# Patient Record
Sex: Female | Born: 1970 | Race: White | Hispanic: No | Marital: Single | Smoking: Never smoker
Health system: Southern US, Community
[De-identification: ages and names within clinical notes are randomized; demographics above are authoritative.]

## PROBLEM LIST (undated history)

## (undated) DIAGNOSIS — I10 Essential (primary) hypertension: Secondary | ICD-10-CM

## (undated) DIAGNOSIS — F319 Bipolar disorder, unspecified: Secondary | ICD-10-CM

## (undated) HISTORY — DX: Bipolar disorder, unspecified: F31.9

## (undated) HISTORY — DX: Essential (primary) hypertension: I10

---

## 2015-03-04 ENCOUNTER — Encounter: Payer: Self-pay | Admitting: Emergency Medicine

## 2015-03-04 ENCOUNTER — Emergency Department
Admission: EM | Admit: 2015-03-04 | Discharge: 2015-03-04 | Disposition: A | Discharge status: Home / Self Care | Payer: Managed Care, Other (non HMO) | Source: Home / Self Care | Attending: Emergency Medicine | Admitting: Emergency Medicine

## 2015-03-04 ENCOUNTER — Encounter: Payer: Self-pay | Admitting: Obstetrics & Gynecology

## 2015-03-04 ENCOUNTER — Ambulatory Visit (INDEPENDENT_AMBULATORY_CARE_PROVIDER_SITE_OTHER): Payer: Managed Care, Other (non HMO) | Admitting: Obstetrics & Gynecology

## 2015-03-04 VITALS — Ht 61.0 in | Wt 167.0 lb

## 2015-03-04 DIAGNOSIS — I10 Essential (primary) hypertension: Secondary | ICD-10-CM

## 2015-03-04 DIAGNOSIS — Z0000 Encounter for general adult medical examination without abnormal findings: Secondary | ICD-10-CM

## 2015-03-04 DIAGNOSIS — Z124 Encounter for screening for malignant neoplasm of cervix: Secondary | ICD-10-CM

## 2015-03-04 DIAGNOSIS — Z01419 Encounter for gynecological examination (general) (routine) without abnormal findings: Secondary | ICD-10-CM

## 2015-03-04 DIAGNOSIS — Z1151 Encounter for screening for human papillomavirus (HPV): Secondary | ICD-10-CM | POA: Diagnosis not present

## 2015-03-04 MED ORDER — METOPROLOL TARTRATE 25 MG PO TABS: 100.0000 mg | ORAL_TABLET | Freq: Two times a day (BID) | ORAL | Status: DC

## 2015-03-04 NOTE — ED Notes (Signed)
Elevated BP 168/106, today, headaches off and on, asymptomatic today

## 2015-03-04 NOTE — Discharge Instructions (Signed)

## 2015-03-04 NOTE — ED Provider Notes (Signed)
CSN: [id number]     Arrival date & time 03/04/15  1419 History   First MD Initiated Contact with Patient 03/04/15 1508     Chief Complaint  Patient presents with  . Hypertension   (Consider location/radiation/quality/duration/timing/severity/associated sxs/prior Treatment) Patient is a 44 y.o. female presenting with hypertension. The history is provided by the patient. No language interpreter was used.  Hypertension This is a new problem. The current episode started more than 1 week ago. The problem occurs constantly. The problem has been gradually worsening. Nothing aggravates the symptoms. Nothing relieves the symptoms. She has tried nothing for the symptoms.  Pt was seen by Dr. Marice Potter and had elevated BP.   Pt is scheduled next week with Tandy Gaw however Dr. Marice Potter wanted her to come here to get started on a medication.   Pt has a history of bipolar dissorder.   Pt psychiatrist advised no maxide, hctz or ace inhibitors.     Past Medical History  Diagnosis Date  . Bipolar 1 disorder    History reviewed. No pertinent past surgical history. Family History  Problem Relation Age of Onset  . Diabetes Maternal Grandfather   . Diabetes Paternal Grandmother   . Cancer Father     melanoma  . Hyperlipidemia Father    History  Substance Use Topics  . Smoking status: Never Smoker   . Smokeless tobacco: Never Used  . Alcohol Use: No   OB History    Gravida Para Term Preterm AB TAB SAB Ectopic Multiple Living   Review of Systems  All other systems reviewed and are negative.   Allergies  Review of patient's allergies indicates not on file.  Home Medications   Prior to Admission medications   Medication Sig Start Date End Date Taking? Authorizing Provider  carbamazepine (CARBATROL) 200 MG 12 hr capsule Take 400 mg by mouth 2 (two) times daily. 12/24/14   Historical Provider, MD  lithium carbonate (LITHOBID) 300 MG CR tablet Take 600 mg by mouth 2 (two) times daily.  12/24/14   Historical Provider, MD   BP 168/106 mmHg  Pulse 91  Temp(Src) 98.6 F (37 C) (Oral)  Ht  (1.549 m)  Wt 167 lb (75.751 kg)  BMI 31.57 kg/m2  SpO2 98%  LMP 02/18/2015 Physical Exam  Constitutional: She is oriented to person, place, and time. She appears well-developed and well-nourished.  HENT:  Head: Normocephalic and atraumatic.  Right Ear: External ear normal.  Left Ear: External ear normal.  Nose: Nose normal.  Mouth/Throat: Oropharynx is clear and moist.  Eyes: EOM are normal. Pupils are equal, round, and reactive to light.  Neck: Normal range of motion.  Cardiovascular: Normal rate and normal heart sounds.   Pulmonary/Chest: Effort normal.  Abdominal: She exhibits no distension.  Musculoskeletal: Normal range of motion.  Neurological: She is alert and oriented to person, place, and time.  Skin: Skin is warm.  Psychiatric: She has a normal mood and affect.  Nursing note and vitals reviewed.   ED Course   Pt reports she had a normal   Procedures (including critical care time) Labs Review Labs Reviewed - No data to display  Imaging Review No results found.   MDM Bp here 168/106.   Pt had normal bun and creatine 3 months ago,    1. Essential hypertension      I discussed with Lesly Rubenstein who will see pt next week. Pt  started on metoprolol 25 bid.  Pt will see Lesly RubensteinJade last week.  Lonia SkinnerLeslie K ArcolaSofia, PA-C 03/04/15 Rickey [id number]

## 2015-03-04 NOTE — Progress Notes (Signed)
Subjective:    Ayani Conwell is a 44 y.o. DW P1 3(44 yo son) female who presents for an annual exam. The patient has no complaints today. The patient is sexually active. GYN screening history: last pap: was normal. The patient wears seatbelts: yes. The patient participates in regular exercise: yes. Has the patient ever been transfused or tattooed?: no. The patient reports that there is not domestic violence in her life.   Menstrual History: OB History    Gravida Para Term Preterm AB TAB SAB Ectopic Multiple Living   1 1 1       2       Menarche age: [age]  Patient's last menstrual period was 02/18/2015.    The following portions of the patient's history were reviewed and updated as appropriate: allergies, current medications, past family history, past medical history, past social history, past surgical history and problem list.  Review of Systems A comprehensive review of systems was negative. Monogamous for 4 years, BF had a vasectomy. Declines STI testing. 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   Objective:    Ht 5\' 1"  (1.549 m)  Wt 167 lb (75.751 kg)  BMI 31.57 kg/m2  LMP 02/18/2015  General Appearance:    Alert, cooperative, no distress, appears stated age  Head:    Normocephalic, without obvious abnormality, atraumatic  Eyes:    PERRL, conjunctiva/corneas clear, EOM's intact, fundi    benign, both eyes  Ears:    Normal TM's and external ear canals, both ears  Nose:   Nares normal, septum midline, mucosa normal, no drainage    or sinus tenderness  Throat:   Lips, mucosa, and tongue normal; teeth and gums normal  Neck:   Supple, symmetrical, trachea midline, no adenopathy;    thyroid:  no enlargement/tenderness/nodules; no carotid   bruit or JVD  Back:     Symmetric, no curvature, ROM normal, no CVA tenderness  Lungs:     Clear to auscultation bilaterally, respirations unlabored  Chest Wall:    No tenderness or deformity   Heart:    Regular rate and rhythm, S1 and S2 normal, no murmur, rub    or gallop  Breast Exam:    No tenderness, masses, or nipple abnormality  Abdomen:     Soft, non-tender, bowel sounds active all four quadrants,    no masses, no organomegaly  Genitalia:    Normal female without lesion, discharge or tenderness, NSSA, NT, mobile, no adnexal masses     Extremities:   Extremities normal, atraumatic, no cyanosis or edema  Pulses:   2+ and symmetric all extremities  Skin:   Skin color, texture, turgor normal, no rashes or lesions  Lymph nodes:   Cervical, supraclavicular, and axillary nodes normal  Neurologic:   CNII-XII intact, normal strength, sensation and reflexes    throughout  .    Assessment:    Healthy female exam.    Plan:     Breast self exam technique reviewed and patient encouraged to perform self-exam monthly. Mammogram. Thin prep Pap smear. with cotesting Fasting labs at her convenience

## 2015-03-06 LAB — CYTOLOGY - PAP

## 2015-03-11 ENCOUNTER — Encounter: Payer: Self-pay | Admitting: Physician Assistant

## 2015-03-11 ENCOUNTER — Ambulatory Visit (INDEPENDENT_AMBULATORY_CARE_PROVIDER_SITE_OTHER): Payer: Managed Care, Other (non HMO) | Admitting: Physician Assistant

## 2015-03-11 VITALS — BP 180/99 | HR 76 | Temp 98.4°F | Resp 20 | Ht 61.0 in | Wt 166.0 lb

## 2015-03-11 DIAGNOSIS — F3174 Bipolar disorder, in full remission, most recent episode manic: Secondary | ICD-10-CM

## 2015-03-11 DIAGNOSIS — Z0000 Encounter for general adult medical examination without abnormal findings: Secondary | ICD-10-CM

## 2015-03-11 DIAGNOSIS — Z1231 Encounter for screening mammogram for malignant neoplasm of breast: Secondary | ICD-10-CM

## 2015-03-11 DIAGNOSIS — Z131 Encounter for screening for diabetes mellitus: Secondary | ICD-10-CM

## 2015-03-11 DIAGNOSIS — S6991XA Unspecified injury of right wrist, hand and finger(s), initial encounter: Secondary | ICD-10-CM

## 2015-03-11 DIAGNOSIS — Z13220 Encounter for screening for lipoid disorders: Secondary | ICD-10-CM

## 2015-03-11 DIAGNOSIS — I10 Essential (primary) hypertension: Secondary | ICD-10-CM

## 2015-03-11 DIAGNOSIS — F319 Bipolar disorder, unspecified: Secondary | ICD-10-CM | POA: Insufficient documentation

## 2015-03-11 MED ORDER — AMLODIPINE BESYLATE 10 MG PO TABS: 10.0000 mg | ORAL_TABLET | Freq: Every day | ORAL | Status: DC

## 2015-03-11 MED ORDER — METOPROLOL TARTRATE 100 MG PO TABS: 100.0000 mg | ORAL_TABLET | Freq: Two times a day (BID) | ORAL | Status: DC

## 2015-03-11 NOTE — Progress Notes (Signed)
Subjective:    Patient ID: Christine Cisneros, female    DOB: 01/18/1971, 44 y.o.   MRN: [id number]  HPI Patient is a 44 year old female who presents to the clinic to establish care.  .. Active Ambulatory Problems    Diagnosis Date Noted  . Bipolar disorder 03/11/2015  . Essential hypertension, benign 03/11/2015   Resolved Ambulatory Problems    Diagnosis Date Noted  . No Resolved Ambulatory Problems   Past Medical History  Diagnosis Date  . Bipolar 1 disorder   . Hypertension    .[name]. Family History  Problem Relation Age of Onset  . Diabetes Maternal Grandfather   . Diabetes Paternal Grandmother   . Cancer Father     melanoma  . Hyperlipidemia Father    .[name]. History   Social History  . Marital Status: Single    Spouse Name: N/A  . Number of Children: N/A  . Years of Education: N/A   Occupational History  . RN    Social History Main Topics  . Smoking status: Never Smoker   . Smokeless tobacco: Never Used  . Alcohol Use: No  . Drug Use: No  . Sexual Activity:    Partners: Male   Other Topics Concern  . Not on file   Social History Narrative   Patient was referred to our office for her blood pressure issues. She was originally seen by Dr. Vania ReaMyrick Dove and blood pressure was in the 180s over 90s. She then went to urgent care and was started on metoprolol 100 mg twice a day. Headaches did resolve with metoprolol. She has no other complaints. She has no vision changes, shortness of breath, chest pains or palpitations. No prior history of hypertension.  She does also complain of some right anterior hand pain after a door hitting her anterior hand as she was trying to block it from hitting her face approximately a week ago. She has not tried anything to make better. Nothing seems to make worse. She has full function of hand. It only hurts when touched over area.  Review of Systems  All other systems reviewed and are negative.      Objective:   Physical Exam   Constitutional: She is oriented to person, place, and time. She appears well-developed and well-nourished.  HENT:  Head: Normocephalic and atraumatic.  Cardiovascular: Normal rate, regular rhythm and normal heart sounds.   Pulmonary/Chest: Effort normal and breath sounds normal.  Musculoskeletal:  Anterior right hand there is a mobile nodule that is tender to palpation.no surrounding swelling or edema. Normal range of motion of right hand. Hand grip 5 out of 5.  Neurological: She is alert and oriented to person, place, and time.  Skin: Skin is dry.  Psychiatric: She has a normal mood and affect. Her behavior is normal.          Assessment & Plan:  HTN- continue on metoprolol 100 mg twice a day. Added Norvasc 10 mg once a day. Patient does have some blood pressure medications that she has to stay away from including Maxide, HCTZ, Ace inhibitors and arms. They can increase her lithium levels. Follow-up with blood pressure recheck in 2 weeks as nurse visit. Will order a CMP, lipid, TSH, CBC.  Right hand contusion- discussed with patient seems like healing contusion. Offered xray to rule out fracture. Pt declined. Encouraged ice. Seemed to be a mobile nodule just under skin may be a ganglion cyst formation due to trauma. Follow up as needed.  Mammogram ordered today.

## 2015-03-18 ENCOUNTER — Ambulatory Visit (INDEPENDENT_AMBULATORY_CARE_PROVIDER_SITE_OTHER): Payer: Managed Care, Other (non HMO) | Admitting: Physician Assistant

## 2015-03-18 VITALS — BP 169/96 | HR 80 | Ht 61.0 in | Wt 166.0 lb

## 2015-03-18 DIAGNOSIS — I10 Essential (primary) hypertension: Secondary | ICD-10-CM

## 2015-03-18 LAB — CBC WITH DIFFERENTIAL/PLATELET
BASOS ABS: 0 10*3/uL (ref 0.0–0.1)
Basophils Relative: 0 % (ref 0–1)
EOS ABS: 0.1 10*3/uL (ref 0.0–0.7)
EOS PCT: 1 % (ref 0–5)
HEMATOCRIT: 40.9 % (ref 36.0–46.0)
Hemoglobin: 13.7 g/dL (ref 12.0–15.0)
Lymphocytes Relative: 10 % — ABNORMAL LOW (ref 12–46)
Lymphs Abs: 1.1 10*3/uL (ref 0.7–4.0)
MCH: 30.6 pg (ref 26.0–34.0)
MCHC: 33.5 g/dL (ref 30.0–36.0)
MCV: 91.5 fL (ref 78.0–100.0)
MONO ABS: 0.7 10*3/uL (ref 0.1–1.0)
MPV: 9.3 fL (ref 8.6–12.4)
Monocytes Relative: 7 % (ref 3–12)
Neutro Abs: 8.7 10*3/uL — ABNORMAL HIGH (ref 1.7–7.7)
Neutrophils Relative %: 82 % — ABNORMAL HIGH (ref 43–77)
PLATELETS: 346 10*3/uL (ref 150–400)
RBC: 4.47 MIL/uL (ref 3.87–5.11)
RDW: 12.5 % (ref 11.5–15.5)
WBC: 10.6 10*3/uL — AB (ref 4.0–10.5)

## 2015-03-18 LAB — LIPID PANEL
Cholesterol: 182 mg/dL (ref 0–200)
HDL: 60 mg/dL (ref >=46)
LDL CALC: 94 mg/dL (ref 0–99)
TRIGLYCERIDES: 140 mg/dL (ref <150)
Total CHOL/HDL Ratio: 3 Ratio
VLDL: 28 mg/dL (ref 0–40)

## 2015-03-18 LAB — COMPLETE METABOLIC PANEL WITH GFR
ALBUMIN: 4.6 g/dL (ref 3.5–5.2)
ALT: 20 U/L (ref 0–35)
AST: 15 U/L (ref 0–37)
Alkaline Phosphatase: 131 U/L — ABNORMAL HIGH (ref 39–117)
BUN: 7 mg/dL (ref 6–23)
CALCIUM: 9.3 mg/dL (ref 8.4–10.5)
CO2: 28 meq/L (ref 19–32)
CREATININE: 0.59 mg/dL (ref 0.50–1.10)
Chloride: 101 mEq/L (ref 96–112)
GFR, Est Non African American: >89 mL/min
GLUCOSE: 108 mg/dL — AB (ref 70–99)
POTASSIUM: 4 meq/L (ref 3.5–5.3)
Sodium: 137 mEq/L (ref 135–145)
Total Bilirubin: 0.4 mg/dL (ref 0.2–1.2)
Total Protein: 7.5 g/dL (ref 6.0–8.3)

## 2015-03-18 LAB — FERRITIN: Ferritin: 48 ng/mL (ref 10–291)

## 2015-03-18 LAB — TSH: TSH: 1.807 u[IU]/mL (ref 0.350–4.500)

## 2015-03-18 MED ORDER — HYDRALAZINE HCL 25 MG PO TABS: 25.0000 mg | ORAL_TABLET | Freq: Three times a day (TID) | ORAL | Status: DC

## 2015-03-18 NOTE — Progress Notes (Signed)
Spoke with Pt, advised of recommendations and new Rx. Pt was able to verbalize medication change and provided correct read back of medication directions. Set Pt up for nurse visit while on the phone.   Pt also wanted to know her lab results. provided information regarding results and recommendations for order of a1c. Advised I would contact the lab to see if they still had the blood or if Pt would need to go give a new sample. Advised I would let her know if she needed to go.  [facility] on [address] [place] [phone]([phone], F:[phone]).

## 2015-03-18 NOTE — Progress Notes (Signed)
Patient came into clinic for BP check. Pt states she was taking the Norvasc in the am but began experiencing side effects. She has been having diarrhea, joint pain, and one night she has some chest discomfort in the upper ride side. Pt states she started taking this Rx at night and the side effects began within 2 days of taking Rx and have gotten better by taking Rx at night but not gone completely. Pt states the lowest reading she has recorded since Rx change has been 135/90, which was within 30min of waking up. Please advise of any changes.   HTN- we certainly don't want to leave her on a medication that is causing side effects. Stop norvasc and see if side effects resolve. Let;s start hydralazine 25mg  TID. Recheck BP in 2 weeks nurse visit. Tandy GawJade Breeback PA-C

## 2015-04-01 ENCOUNTER — Ambulatory Visit (INDEPENDENT_AMBULATORY_CARE_PROVIDER_SITE_OTHER): Payer: Managed Care, Other (non HMO) | Admitting: Family Medicine

## 2015-04-01 VITALS — BP 145/89 | HR 74 | Wt 166.0 lb

## 2015-04-01 DIAGNOSIS — R7309 Other abnormal glucose: Secondary | ICD-10-CM | POA: Diagnosis not present

## 2015-04-01 DIAGNOSIS — I10 Essential (primary) hypertension: Secondary | ICD-10-CM | POA: Diagnosis not present

## 2015-04-01 DIAGNOSIS — R739 Hyperglycemia, unspecified: Secondary | ICD-10-CM

## 2015-04-01 MED ORDER — HYDRALAZINE HCL 50 MG PO TABS
50.0000 mg | ORAL_TABLET | Freq: Three times a day (TID) | ORAL | Status: DC
Start: 2015-04-01 — End: 2015-04-15

## 2015-04-01 NOTE — Progress Notes (Signed)
Called Pt and advised of new Rx. Went over dosage change and to continue keeping her BP log. Pt states there is an app she can download on her phone to keep these readings in and when she comes in for her next nurse visit we will be able to transfer them to her chart. Advised this was a very good plan. Set Pt up for 2 week nurse visit while on the phone. Advised to contact us with any questions/concerns. Verbalized understanding.

## 2015-04-01 NOTE — Progress Notes (Signed)
   Subjective:    Patient ID: Christine Cisneros, female    DOB: 12/16/1970, 44 y.o.   MRN: [id number]  HPI Patient came into office today for nurse visit BP check.   Review of Systems     Objective:   Physical Exam        Assessment & Plan:  Patient reports she takes her BP at home and her values range from 160/90's before Rx's and 145/80's after Rx's. Patient states she has no side effects from the new Rx (Hydralazine) but would like to know if there was an option that was BID rather than TID. She sometimes forgets and takes the lunchtime dose later than she should due to work. She voices knowledge of having to be selective on her BP meds due to her Lithium Rx. Patient was also supposed to get an A1c added onto her blood work from last visit, inquired about results. Upon review, there were no results in her chart. Called [facility], there was documentation from where the lab was to be added but never completed. Apologized for their error. [facility] is [facility] blood work from another provider tomorrow, printed an order for A1c to take with her for completion. There were no further questions, advised I would contact her with any changes.

## 2015-04-01 NOTE — Progress Notes (Signed)
Christine Cisneros,  Will you please let patient know that her BP appears to be improving but is not at goal yet therefore I'd recommend taking a slightly higher dose of hydralazine.  Specifically 50mg  TID that I sent to [facility], unfortunately there is no BID dosing regimen and a lot of the other BP med options will mess with her Lithium levels.  Continue taking metoprolol and return for a nurse visit BP check two weeks after the new dose of hydralazine.

## 2015-04-10 ENCOUNTER — Ambulatory Visit: Payer: Managed Care, Other (non HMO)

## 2015-04-15 ENCOUNTER — Ambulatory Visit (HOSPITAL_BASED_OUTPATIENT_CLINIC_OR_DEPARTMENT_OTHER)
Admission: RE | Admit: 2015-04-15 | Discharge: 2015-04-15 | Disposition: A | Discharge status: Home / Self Care | Payer: Managed Care, Other (non HMO) | Source: Ambulatory Visit | Attending: Obstetrics & Gynecology | Admitting: Obstetrics & Gynecology

## 2015-04-15 ENCOUNTER — Ambulatory Visit (INDEPENDENT_AMBULATORY_CARE_PROVIDER_SITE_OTHER): Payer: Managed Care, Other (non HMO) | Admitting: Family Medicine

## 2015-04-15 VITALS — BP 151/81 | HR 62

## 2015-04-15 DIAGNOSIS — Z1231 Encounter for screening mammogram for malignant neoplasm of breast: Secondary | ICD-10-CM | POA: Diagnosis not present

## 2015-04-15 DIAGNOSIS — I10 Essential (primary) hypertension: Secondary | ICD-10-CM | POA: Diagnosis not present

## 2015-04-15 MED ORDER — METOPROLOL TARTRATE 100 MG PO TABS: 100.0000 mg | ORAL_TABLET | Freq: Two times a day (BID) | ORAL | Status: DC

## 2015-04-15 MED ORDER — HYDRALAZINE HCL 100 MG PO TABS: 100.0000 mg | ORAL_TABLET | Freq: Two times a day (BID) | ORAL | Status: DC

## 2015-04-15 NOTE — Addendum Note (Signed)
Addended by: Collie SiadICHARDSON, Tamirah George M on: 04/15/2015 04:46 PM   Modules accepted: Orders

## 2015-04-15 NOTE — Progress Notes (Signed)
Just to clarify, you would like the Pt to take Hydralazine 100mg  TID. The new Rx was written with the directions of BID and quantity of TID.

## 2015-04-15 NOTE — Progress Notes (Signed)
Patient also requested during nurse visit she needs a refill on her Metoprolol Rx, sent over 90 day supply.

## 2015-04-15 NOTE — Progress Notes (Signed)
   Subjective:    Patient ID: Christine Cisneros, female    DOB: 03/21/1971, 44 y.o.   MRN: [id number]  HPI Patient is here for blood pressure. States she has been taking her medications at the new dose, and has done much better with remembering her lunchtime does. She has an alarm on her phone that goes off at noon to remind her to take the Rx. Patient states she keeps a log at home of her readings but forgot to bring them. Her usual is around 140/80 but has been up to 150/84. Her BP is typically higher in the evening.    Review of Systems     Objective:   Physical Exam        Assessment & Plan:  Took Patient's BP and advised I will route to Provider for review. Pt requested 90 supply on her Rx's so she will have a cheaper co-pay. Advised if we keep the Rx's how they are currently I will send over a 90 day supply, if the Rx's are to change we will keep at 30 day supply until correct regime is determined. Verbalized understanding.

## 2015-04-15 NOTE — Progress Notes (Signed)
BP numbers are improving but not at goal, I'd recommend increasing the dose of hydralazine to 100mg  TID. If this brings her numbers down to below 140/90 a 90 supply can be provided.  One more nurse visit for BP check in one week.

## 2015-04-15 NOTE — Progress Notes (Signed)
Clarified verbally with Dr. Ivan AnchorsHommel on directions. Patient to take Rx TID. Attempted to contact Pt, no answer. Left information on voicemail and advised of one week check. Callback information provided for any Rx questions and to schedule f/u appt.

## 2015-04-30 ENCOUNTER — Other Ambulatory Visit: Payer: Self-pay | Admitting: Physician Assistant

## 2015-04-30 LAB — HEMOGLOBIN A1C
Hgb A1c MFr Bld: 5.5 % (ref <5.7)
Mean Plasma Glucose: 111 mg/dL (ref <117)

## 2015-04-30 MED ORDER — HYDRALAZINE HCL 100 MG PO TABS: 100.0000 mg | ORAL_TABLET | Freq: Two times a day (BID) | ORAL | Status: DC

## 2015-07-07 ENCOUNTER — Encounter: Payer: Self-pay | Admitting: Physician Assistant

## 2015-07-07 ENCOUNTER — Ambulatory Visit (INDEPENDENT_AMBULATORY_CARE_PROVIDER_SITE_OTHER): Payer: Managed Care, Other (non HMO) | Admitting: Physician Assistant

## 2015-07-07 VITALS — BP 131/76 | HR 60 | Ht 61.0 in | Wt 166.0 lb

## 2015-07-07 DIAGNOSIS — I10 Essential (primary) hypertension: Secondary | ICD-10-CM

## 2015-07-07 MED ORDER — METOPROLOL TARTRATE 100 MG PO TABS: 100.0000 mg | ORAL_TABLET | Freq: Two times a day (BID) | ORAL | Status: DC

## 2015-07-07 MED ORDER — HYDRALAZINE HCL 100 MG PO TABS: 100.0000 mg | ORAL_TABLET | Freq: Three times a day (TID) | ORAL | Status: DC

## 2015-07-07 NOTE — Progress Notes (Signed)
   Subjective:    Patient ID: Christine Cisneros, female    DOB: 11-Dec-1970, 44 y.o.   MRN: [id number]  HPI  Pt presents to the clinic for HTN follow up. She is currently taking metoprolol  Bid  and hydralazine tid. She is doing great with no concerns. She denies any side effects. No CP/palpitations/SOB/dizziness. She takes her BP at home and ranging from 110-130/75-85.   Review of Systems  All other systems reviewed and are negative.      Objective:   Physical Exam  Constitutional: She is oriented to person, place, and time. She appears well-developed and well-nourished.  HENT:  Head: Normocephalic and atraumatic.  Cardiovascular: Normal rate, regular rhythm and normal heart sounds.   Pulmonary/Chest: Effort normal and breath sounds normal.  Neurological: She is alert and oriented to person, place, and time.  Psychiatric: She has a normal mood and affect. Her behavior is normal.          Assessment & Plan:  HTN- controlled today. Encouraged to keep checking BP and to keep under 140/90. Will check cmp at next visit in 6 months. Refilled metoprolol and hydralazine at same dose.   Bipolar- managed by psych doing well and controlled.

## 2015-12-03 ENCOUNTER — Other Ambulatory Visit: Payer: Self-pay | Admitting: *Deleted

## 2015-12-03 MED ORDER — METOPROLOL TARTRATE 100 MG PO TABS: 100.0000 mg | ORAL_TABLET | Freq: Two times a day (BID) | ORAL | Status: DC

## 2015-12-03 MED ORDER — HYDRALAZINE HCL 100 MG PO TABS: 100.0000 mg | ORAL_TABLET | Freq: Three times a day (TID) | ORAL | Status: DC

## 2016-01-05 ENCOUNTER — Encounter: Payer: Self-pay | Admitting: Physician Assistant

## 2016-01-05 ENCOUNTER — Ambulatory Visit (INDEPENDENT_AMBULATORY_CARE_PROVIDER_SITE_OTHER): Payer: PRIVATE HEALTH INSURANCE | Admitting: Physician Assistant

## 2016-01-05 VITALS — BP 130/82 | HR 63 | Ht 61.0 in | Wt 169.0 lb

## 2016-01-05 DIAGNOSIS — Z79899 Other long term (current) drug therapy: Secondary | ICD-10-CM | POA: Diagnosis not present

## 2016-01-05 DIAGNOSIS — I10 Essential (primary) hypertension: Secondary | ICD-10-CM

## 2016-01-05 MED ORDER — HYDRALAZINE HCL 100 MG PO TABS: 100.0000 mg | ORAL_TABLET | Freq: Three times a day (TID) | ORAL | Status: DC

## 2016-01-05 MED ORDER — METOPROLOL TARTRATE 100 MG PO TABS: 100.0000 mg | ORAL_TABLET | Freq: Two times a day (BID) | ORAL | Status: DC

## 2016-01-05 NOTE — Progress Notes (Signed)
   Subjective:    Patient ID: Christine Cisneros, female    DOB: 24-Aug-1971, 45 y.o.   MRN: [id number]  HPI Pt presents to the clinic for 6 month follow up on HTN. No CP, palpitations, headaches, vision changes. Checks at home and always below 140/90. Taking metoprolol and hydralazine.   She request me to order labs for behavioral health because of cost.    Review of Systems  All other systems reviewed and are negative.      Objective:   Physical Exam  Constitutional: She is oriented to person, place, and time. She appears well-developed and well-nourished.  HENT:  Head: Normocephalic and atraumatic.  Cardiovascular: Normal rate, regular rhythm and normal heart sounds.   Pulmonary/Chest: Effort normal and breath sounds normal. She has no wheezes.  Neurological: She is alert and oriented to person, place, and time.  Psychiatric: She has a normal mood and affect. Her behavior is normal.          Assessment & Plan:  HTN- rechecked BP and looks great. Hydralazine and metoprolol refilled for 6 months. cmp ordered.   Medication management- ordered litium for psych meds because if I order free for patient. Will fax to behavioral health.   Need CPE in 6 months.

## 2016-01-20 LAB — LITHIUM LEVEL: LITHIUM LVL: 0.8

## 2016-01-21 ENCOUNTER — Other Ambulatory Visit: Payer: Self-pay | Admitting: Physician Assistant

## 2016-01-23 ENCOUNTER — Encounter: Payer: Self-pay | Admitting: Physician Assistant

## 2016-01-24 ENCOUNTER — Other Ambulatory Visit: Payer: Self-pay | Admitting: Physician Assistant

## 2016-01-29 ENCOUNTER — Other Ambulatory Visit: Payer: Self-pay | Admitting: *Deleted

## 2016-01-29 MED ORDER — HYDRALAZINE HCL 100 MG PO TABS: 100.0000 mg | ORAL_TABLET | Freq: Three times a day (TID) | ORAL | Status: DC

## 2016-02-08 ENCOUNTER — Other Ambulatory Visit: Payer: Self-pay | Admitting: Physician Assistant

## 2016-04-19 ENCOUNTER — Telehealth: Payer: Self-pay | Admitting: *Deleted

## 2016-04-19 DIAGNOSIS — Z1231 Encounter for screening mammogram for malignant neoplasm of breast: Secondary | ICD-10-CM

## 2016-04-19 NOTE — Telephone Encounter (Signed)
Mammo ordered.

## 2016-04-26 ENCOUNTER — Ambulatory Visit (HOSPITAL_BASED_OUTPATIENT_CLINIC_OR_DEPARTMENT_OTHER): Payer: PRIVATE HEALTH INSURANCE

## 2016-05-03 ENCOUNTER — Ambulatory Visit (HOSPITAL_BASED_OUTPATIENT_CLINIC_OR_DEPARTMENT_OTHER)
Admission: RE | Admit: 2016-05-03 | Discharge: 2016-05-03 | Disposition: A | Discharge status: Home / Self Care | Payer: No Typology Code available for payment source | Source: Ambulatory Visit | Attending: Physician Assistant | Admitting: Physician Assistant

## 2016-05-03 DIAGNOSIS — Z1231 Encounter for screening mammogram for malignant neoplasm of breast: Secondary | ICD-10-CM | POA: Insufficient documentation

## 2016-06-21 ENCOUNTER — Encounter: Payer: PRIVATE HEALTH INSURANCE | Admitting: Physician Assistant

## 2016-07-05 ENCOUNTER — Ambulatory Visit (INDEPENDENT_AMBULATORY_CARE_PROVIDER_SITE_OTHER): Payer: PRIVATE HEALTH INSURANCE | Admitting: Physician Assistant

## 2016-07-05 ENCOUNTER — Encounter: Payer: Self-pay | Admitting: Physician Assistant

## 2016-07-05 VITALS — BP 159/74 | HR 85 | Ht 61.0 in | Wt 174.0 lb

## 2016-07-05 DIAGNOSIS — Z13220 Encounter for screening for lipoid disorders: Secondary | ICD-10-CM

## 2016-07-05 DIAGNOSIS — F3170 Bipolar disorder, currently in remission, most recent episode unspecified: Secondary | ICD-10-CM

## 2016-07-05 DIAGNOSIS — Z131 Encounter for screening for diabetes mellitus: Secondary | ICD-10-CM

## 2016-07-05 DIAGNOSIS — R609 Edema, unspecified: Secondary | ICD-10-CM

## 2016-07-05 DIAGNOSIS — I10 Essential (primary) hypertension: Secondary | ICD-10-CM

## 2016-07-05 DIAGNOSIS — Z23 Encounter for immunization: Secondary | ICD-10-CM | POA: Diagnosis not present

## 2016-07-05 DIAGNOSIS — Z0000 Encounter for general adult medical examination without abnormal findings: Secondary | ICD-10-CM | POA: Diagnosis not present

## 2016-07-05 DIAGNOSIS — Z79899 Other long term (current) drug therapy: Secondary | ICD-10-CM

## 2016-07-05 MED ORDER — FUROSEMIDE 20 MG PO TABS: 20.0000 mg | ORAL_TABLET | Freq: Every day | ORAL | 1 refills | Status: DC

## 2016-07-05 MED ORDER — METOPROLOL TARTRATE 100 MG PO TABS: 100.0000 mg | ORAL_TABLET | Freq: Two times a day (BID) | ORAL | 1 refills | Status: DC

## 2016-07-05 MED ORDER — HYDRALAZINE HCL 100 MG PO TABS: 100.0000 mg | ORAL_TABLET | Freq: Three times a day (TID) | ORAL | 1 refills | Status: DC

## 2016-07-05 NOTE — Patient Instructions (Signed)

## 2016-07-06 ENCOUNTER — Encounter: Payer: Self-pay | Admitting: Physician Assistant

## 2016-07-06 NOTE — Progress Notes (Signed)
Subjective:     Christine Cisneros is a 45 y.o. female and is here for a comprehensive physical exam. The patient reports problems - pt is concerned with non-pitting edema of bilateral feet and ankles. she has notice this increasing over summer. it is worse when she sits all day or stands all day. she recently bought some compression stockings but she has not tried yet. .  Social History   Social History  . Marital status: Single    Spouse name: N/A  . Number of children: N/A  . Years of education: N/A   Occupational History  . RN    Social History Main Topics  . Smoking status: Never Smoker  . Smokeless tobacco: Never Used  . Alcohol use No  . Drug use: No  . Sexual activity: Yes    Partners: Male   Other Topics Concern  . Not on file   Social History Narrative  . No narrative on file   Health Maintenance  Topic Date Due  . HIV Screening  07/05/2017 (Originally 11/17/1985)  . MAMMOGRAM  05/03/2017  . PAP SMEAR  03/03/2018  . TETANUS/TDAP  10/23/2018  . INFLUENZA VACCINE  Completed    The following portions of the patient's history were reviewed and updated as appropriate: allergies, current medications, past family history, past medical history, past social history, past surgical history and problem list.  Review of Systems Pertinent items noted in HPI and remainder of comprehensive ROS otherwise negative.   Objective:    BP (!) 159/74   Pulse 85   Ht 5\' 1"  (1.549 m)   Wt 174 lb (78.9 kg)   BMI 32.88 kg/m  General appearance: alert, cooperative and appears stated age Head: Normocephalic, without obvious abnormality, atraumatic Eyes: conjunctivae/corneas clear. PERRL, EOM's intact. Fundi benign. Ears: normal TM's and external ear canals both ears Nose: Nares normal. Septum midline. Mucosa normal. No drainage or sinus tenderness. Throat: lips, mucosa, and tongue normal; teeth and gums normal Neck: no adenopathy, no carotid bruit, no JVD, supple, symmetrical, trachea  midline and thyroid not enlarged, symmetric, no tenderness/mass/nodules Back: symmetric, no curvature. ROM normal. No CVA tenderness. Lungs: clear to auscultation bilaterally Heart: regular rate and rhythm, S1, S2 normal, no murmur, click, rub or gallop Abdomen: soft, non-tender; bowel sounds normal; no masses,  no organomegaly Extremities: edema non pitting over bilateral feet, mild.  Pulses: 2+ and symmetric Skin: Skin color, texture, turgor normal. No rashes or lesions Lymph nodes: Cervical, supraclavicular, and axillary nodes normal. Neurologic: Alert and oriented X 3, normal strength and tone. Normal symmetric reflexes. Normal coordination and gait    Assessment:    Healthy female exam.      Plan:    CPE- flu shot given. Pap up to date sees Dr. Penne LashLeggett next door. Cbc, lipid, cmp ordered. Discussed weight loss and exercise 150minutes a week. Encouraged vitamin D 800 units and calcium 1500mg  daily. Mammogram up to date.   Edema- will add lasix 20mg  daily. HCTZ is contraindicated with lithium. This could also help with BP elevation. Discussed causes of edema. Discussed conservative intervention with compression stockings, elevation, low salt diet.  Follow up in 2 months.   HTN- elevated today. Added lasix. Recheck in 1-2 months.   Bipolar- meds managed by pysch. Will check lithum level and send to pysch.  See After Visit Summary for Counseling Recommendations

## 2016-08-02 LAB — CBC AND DIFFERENTIAL
HCT: 40 % (ref 36–46)
Hemoglobin: 13.5 g/dL (ref 12.0–16.0)
Platelets: 321 K/µL (ref 150–399)
WBC: 8.5 10*3/mL

## 2016-08-02 LAB — LIPID PANEL
Cholesterol: 190 mg/dL (ref 0–200)
HDL: 72 mg/dL — AB (ref 35–70)
LDL Cholesterol: 87 mg/dL
Triglycerides: 156 mg/dL (ref 40–160)

## 2016-08-02 LAB — LITHIUM LEVEL: Lithium: 0.7

## 2016-08-02 LAB — TSH: TSH: 3.23 u[IU]/mL (ref 0.41–5.90)

## 2016-08-03 ENCOUNTER — Encounter: Payer: Self-pay | Admitting: Physician Assistant

## 2016-08-03 DIAGNOSIS — E781 Pure hyperglyceridemia: Secondary | ICD-10-CM | POA: Insufficient documentation

## 2016-08-16 ENCOUNTER — Encounter: Payer: Self-pay | Admitting: Physician Assistant

## 2016-11-09 ENCOUNTER — Other Ambulatory Visit: Payer: Self-pay | Admitting: Physician Assistant

## 2016-11-26 ENCOUNTER — Telehealth: Payer: Self-pay | Admitting: Physician Assistant

## 2016-11-26 NOTE — Telephone Encounter (Signed)
From what I can see I coded under routine physical and screening labs. Can we call [facility] and give new codes.codes are in the chart.  As well as confirm that we have done so with patient.

## 2016-11-26 NOTE — Telephone Encounter (Signed)
Christine Cisneros is being billed for blood work or lab order that was completed on  08/02/16 @ [facility]. [facility] and her insurance have stated that the coding for the procedure is in need of change b/c she is being charged a substantial amount. Her insurance has provided that she needs the code changed to a wellness code. **[facility] needs to be contacted with the new code** please give a call to [name] with any questions or concerns and feel notify her when this is completed. [name] was so nice and cooperating with this procedure.

## 2017-01-03 ENCOUNTER — Telehealth: Payer: Self-pay | Admitting: *Deleted

## 2017-01-03 NOTE — Telephone Encounter (Signed)
She needs [facility] called to change the coding for the 9/17 visit b/c she's been working on this since December last year.

## 2017-01-05 NOTE — Telephone Encounter (Signed)
I called and spoke with Lowella BandyNikki at [employer] [phone]) and reviewed all the diagnosis codes that were used.  The diagnosis codes are all appropriate therefore I could not add any new ones to refile the claim.  The denial is something [employer] is not familiar with.   I don't think this is a diagnosis issue, I think it is an Paediatric nursensurance issue.  Her plan is an Affordable Care Act Plan and is very specific for labs.  I called and had to leave a message for Petronella.  I told her to call her Insurance plan and ask why they are not covering the labs.  I told her that if she needed my help, to call me directly.

## 2017-01-10 ENCOUNTER — Ambulatory Visit: Payer: PRIVATE HEALTH INSURANCE | Admitting: Physician Assistant

## 2017-01-17 ENCOUNTER — Ambulatory Visit: Payer: PRIVATE HEALTH INSURANCE | Admitting: Physician Assistant

## 2017-01-31 ENCOUNTER — Encounter: Payer: Self-pay | Admitting: Physician Assistant

## 2017-01-31 ENCOUNTER — Ambulatory Visit (INDEPENDENT_AMBULATORY_CARE_PROVIDER_SITE_OTHER): Payer: PRIVATE HEALTH INSURANCE | Admitting: Physician Assistant

## 2017-01-31 VITALS — BP 135/82 | HR 60 | Ht 61.0 in | Wt 170.0 lb

## 2017-01-31 DIAGNOSIS — R600 Localized edema: Secondary | ICD-10-CM | POA: Diagnosis not present

## 2017-01-31 DIAGNOSIS — I10 Essential (primary) hypertension: Secondary | ICD-10-CM | POA: Diagnosis not present

## 2017-01-31 MED ORDER — HYDRALAZINE HCL 100 MG PO TABS: 100.0000 mg | ORAL_TABLET | Freq: Three times a day (TID) | ORAL | 3 refills | Status: DC

## 2017-01-31 MED ORDER — METOPROLOL TARTRATE 100 MG PO TABS: 100.0000 mg | ORAL_TABLET | Freq: Two times a day (BID) | ORAL | 3 refills | Status: DC

## 2017-01-31 MED ORDER — FUROSEMIDE 20 MG PO TABS: 20.0000 mg | ORAL_TABLET | Freq: Every day | ORAL | 3 refills | Status: DC

## 2017-01-31 NOTE — Progress Notes (Signed)
   Subjective:    Patient ID: Christine Cisneros, female    DOB: 10/27/1971, 46 y.o.   MRN: [id number]  HPI Pt is a 46 yo female who presents to the clinic to follow up on bilateral lower extremity edema and HTN. She was supposed to follow up in 2 months but has not. Denies any CP, palpitations, headaches, dizziness. Her edema is much better and only flares on long car rides or standing on her feet for many hours. She does wear low grade compression stockings which help and takes lasix 20mg  daily. For BP she is on metoprolol and hydralazine.    Review of Systems  All other systems reviewed and are negative.      Objective:   Physical Exam  Constitutional: She is oriented to person, place, and time. She appears well-developed and well-nourished.  HENT:  Head: Normocephalic and atraumatic.  Cardiovascular: Normal rate, regular rhythm and normal heart sounds.   Pulmonary/Chest: Effort normal and breath sounds normal.  Musculoskeletal:  No edema noted today.   Neurological: She is alert and oriented to person, place, and time.  Psychiatric: She has a normal mood and affect. Her behavior is normal.          Assessment & Plan:  [name].[name].Abuk was seen today for hypertension.  Diagnoses and all orders for this visit:  Essential hypertension, benign -     furosemide (LASIX) 20 MG tablet; Take 1 tablet (20 mg total) by mouth daily. -     hydrALAZINE (APRESOLINE) 100 MG tablet; Take 1 tablet (100 mg total) by mouth 3 (three) times daily. -     metoprolol (LOPRESSOR) 100 MG tablet; Take 1 tablet (100 mg total) by mouth 2 (two) times daily.  Bilateral lower extremity edema -     furosemide (LASIX) 20 MG tablet; Take 1 tablet (20 mg total) by mouth daily.   BP looks great. Refill until CPE in September. Will get labs then.

## 2017-07-04 ENCOUNTER — Other Ambulatory Visit: Payer: Self-pay | Admitting: Physician Assistant

## 2017-07-04 DIAGNOSIS — Z1231 Encounter for screening mammogram for malignant neoplasm of breast: Secondary | ICD-10-CM

## 2017-08-01 ENCOUNTER — Encounter: Payer: PRIVATE HEALTH INSURANCE | Admitting: Physician Assistant

## 2017-08-01 ENCOUNTER — Ambulatory Visit (HOSPITAL_BASED_OUTPATIENT_CLINIC_OR_DEPARTMENT_OTHER): Payer: PRIVATE HEALTH INSURANCE

## 2017-08-08 ENCOUNTER — Encounter: Payer: Self-pay | Admitting: Physician Assistant

## 2017-08-08 ENCOUNTER — Ambulatory Visit (INDEPENDENT_AMBULATORY_CARE_PROVIDER_SITE_OTHER): Payer: PRIVATE HEALTH INSURANCE | Admitting: Physician Assistant

## 2017-08-08 ENCOUNTER — Ambulatory Visit (HOSPITAL_BASED_OUTPATIENT_CLINIC_OR_DEPARTMENT_OTHER)
Admission: RE | Admit: 2017-08-08 | Discharge: 2017-08-08 | Disposition: A | Discharge status: Home / Self Care | Payer: PRIVATE HEALTH INSURANCE | Source: Ambulatory Visit | Attending: Physician Assistant | Admitting: Physician Assistant

## 2017-08-08 VITALS — BP 159/76 | HR 56 | Ht 61.0 in | Wt 167.0 lb

## 2017-08-08 DIAGNOSIS — Z6830 Body mass index (BMI) 30.0-30.9, adult: Secondary | ICD-10-CM | POA: Diagnosis not present

## 2017-08-08 DIAGNOSIS — Z1231 Encounter for screening mammogram for malignant neoplasm of breast: Secondary | ICD-10-CM | POA: Diagnosis not present

## 2017-08-08 DIAGNOSIS — Z13220 Encounter for screening for lipoid disorders: Secondary | ICD-10-CM | POA: Diagnosis not present

## 2017-08-08 DIAGNOSIS — Z0000 Encounter for general adult medical examination without abnormal findings: Secondary | ICD-10-CM

## 2017-08-08 DIAGNOSIS — I10 Essential (primary) hypertension: Secondary | ICD-10-CM

## 2017-08-08 DIAGNOSIS — Z23 Encounter for immunization: Secondary | ICD-10-CM | POA: Diagnosis not present

## 2017-08-08 DIAGNOSIS — Z131 Encounter for screening for diabetes mellitus: Secondary | ICD-10-CM | POA: Diagnosis not present

## 2017-08-08 NOTE — Patient Instructions (Signed)

## 2017-08-10 ENCOUNTER — Encounter: Payer: Self-pay | Admitting: Physician Assistant

## 2017-08-10 DIAGNOSIS — Z6830 Body mass index (BMI) 30.0-30.9, adult: Secondary | ICD-10-CM | POA: Insufficient documentation

## 2017-08-10 NOTE — Progress Notes (Signed)
Subjective:     Florella Wilkinson is a 46 y.o. female and is here for a comprehensive physical exam. The patient reports no problems.  Social History   Social History  . Marital status: Single    Spouse name: N/A  . Number of children: N/A  . Years of education: N/A   Occupational History  . RN    Social History Main Topics  . Smoking status: Never Smoker  . Smokeless tobacco: Never Used  . Alcohol use No  . Drug use: No  . Sexual activity: Yes    Partners: Male   Other Topics Concern  . Not on file   Social History Narrative  . No narrative on file   Health Maintenance  Topic Date Due  . HIV Screening  08/08/2018 (Originally 11/17/1985)  . MAMMOGRAM  08/08/2018  . TETANUS/TDAP  10/23/2018  . PAP SMEAR  03/03/2020  . INFLUENZA VACCINE  Completed    The following portions of the patient's history were reviewed and updated as appropriate: allergies, current medications, past family history, past medical history, past social history, past surgical history and problem list.  Review of Systems A comprehensive review of systems was negative.   Objective:    BP (!) 159/76   Pulse (!) 56   Ht  (1.549 m)   Wt 167 lb (75.8 kg)   BMI 31.55 kg/m  General appearance: alert, cooperative, mildly obese and moderately obese Head: Normocephalic, without obvious abnormality, atraumatic Eyes: conjunctivae/corneas clear. PERRL, EOM's intact. Fundi benign. Ears: normal TM's and external ear canals both ears Nose: Nares normal. Septum midline. Mucosa normal. No drainage or sinus tenderness. Throat: lips, mucosa, and tongue normal; teeth and gums normal Neck: no adenopathy, no carotid bruit, no JVD, supple, symmetrical, trachea midline and thyroid not enlarged, symmetric, no tenderness/mass/nodules Back: symmetric, no curvature. ROM normal. No CVA tenderness. Lungs: clear to auscultation bilaterally Heart: regular rate and rhythm, S1, S2 normal, no murmur, click, rub or  gallop Abdomen: soft, non-tender; bowel sounds normal; no masses,  no organomegaly Extremities: extremities normal, atraumatic, no cyanosis or edema Pulses: 2+ and symmetric Skin: Skin color, texture, turgor normal. No rashes or lesions Lymph nodes: Cervical, supraclavicular, and axillary nodes normal. Neurologic: Alert and oriented X 3, normal strength and tone. Normal symmetric reflexes. Normal coordination and gait    Assessment:    Healthy female exam.      Plan:    Marland KitchenMarland KitchenCamie was seen today for annual exam.  Diagnoses and all orders for this visit:  Routine physical examination -     TSH -     CBC -     VITAMIN D 25 Hydroxy (Vit-D Deficiency, Fractures) -     Vitamin B12 -     Lipid panel -     Comprehensive metabolic panel  Influenza vaccine needed -     Flu Vaccine QUAD 6+ mos PF IM (Fluarix Quad PF)  Screening for diabetes mellitus -     Comprehensive metabolic panel  Screening for lipoid disorders -     Lipid panel  Essential hypertension, benign  BMI 30.0-30.9,adult   .[name] Depression screen [facility] 2/9 08/08/2017 01/31/2017  Decreased Interest 0 0  Down, Depressed, Hopeless 0 0  PHQ - 2 Score 0 0    Encouraged vitamin D 1000 units and Calcium  or 4 servings of dairy a day.  [name].Discussed low carb diet with 1500 calories and 80g of protein.  Exercising at least 150 minutes a week.  [employer]itness Pal could be a Chief Technology Officer.  Follow up to discuss medication options.   Pt was out of BP medication. Pt will start back taking it and recheck in 2 weeks with nurse visit.   Mammogram and pap up to date.  Vaccines up to date.   See After Visit Summary for Counseling Recommendations

## 2018-02-06 ENCOUNTER — Ambulatory Visit (INDEPENDENT_AMBULATORY_CARE_PROVIDER_SITE_OTHER): Payer: Self-pay | Admitting: Physician Assistant

## 2018-02-06 ENCOUNTER — Encounter: Payer: Self-pay | Admitting: Physician Assistant

## 2018-02-06 VITALS — BP 138/84 | HR 59 | Ht 61.0 in | Wt 174.0 lb

## 2018-02-06 DIAGNOSIS — Z79899 Other long term (current) drug therapy: Secondary | ICD-10-CM

## 2018-02-06 DIAGNOSIS — R600 Localized edema: Secondary | ICD-10-CM

## 2018-02-06 DIAGNOSIS — I10 Essential (primary) hypertension: Secondary | ICD-10-CM

## 2018-02-06 MED ORDER — HYDRALAZINE HCL 100 MG PO TABS: 100.0000 mg | ORAL_TABLET | Freq: Three times a day (TID) | ORAL | 3 refills | Status: DC

## 2018-02-06 MED ORDER — FUROSEMIDE 20 MG PO TABS: 20.0000 mg | ORAL_TABLET | Freq: Every day | ORAL | 3 refills | Status: DC

## 2018-02-06 MED ORDER — METOPROLOL TARTRATE 100 MG PO TABS: 100.0000 mg | ORAL_TABLET | Freq: Two times a day (BID) | ORAL | 3 refills | Status: DC

## 2018-02-06 NOTE — Progress Notes (Signed)
   Subjective:    Patient ID: Christine Cisneros, female    DOB: 09/18/1971, 47 y.o.   MRN: [id number]  HPI Patient is a 47 year old female with a history of Bipolar and HTN presents today for medication check up. She Has been taking her medications as prescribed. She only has a question about her dosing and that she is going 8-9 hours after her lunch time dose. She says that if she goes that long without her  Medication she will sometimes feel her heart beating a little faster than normal but denies any chest pain, shortness of breath, headache, visual changes. She denies any leg swelling and does wear compression socks daily.   Past Medical History:  Diagnosis Date  . Bipolar 1 disorder (HCC)   . Hypertension       Review of Systems  Constitutional: Negative for activity change, appetite change and fatigue.  Eyes: Negative for redness and visual disturbance.  Respiratory: Negative for chest tightness and shortness of breath.   Cardiovascular: Positive for palpitations ( only when extended periods between meds). Negative for chest pain and leg swelling.  Gastrointestinal: Negative for abdominal pain, nausea and vomiting.  Neurological: Negative for dizziness, weakness, light-headedness and headaches.  Psychiatric/Behavioral: Negative for agitation and behavioral problems. The patient is not nervous/anxious and is not hyperactive.        Objective:   Physical Exam  Constitutional: She is oriented to person, place, and time. She appears well-developed and well-nourished.  HENT:  Head: Normocephalic and atraumatic.  Eyes: Conjunctivae are normal.  Cardiovascular: Normal rate, regular rhythm and normal heart sounds.  Pulmonary/Chest: Effort normal and breath sounds normal.  Neurological: She is alert and oriented to person, place, and time.  Skin: Skin is warm and dry.  Psychiatric: She has a normal mood and affect. Her behavior is normal.          Assessment & Plan:  [name].[name].Kanisha was seen  today for hypertension.  Diagnoses and all orders for this visit:  Essential hypertension, benign -     hydrALAZINE (APRESOLINE) 100 MG tablet; Take 1 tablet (100 mg total) by mouth 3 (three) times daily. -     metoprolol tartrate (LOPRESSOR) 100 MG tablet; Take 1 tablet (100 mg total) by mouth 2 (two) times daily. -     furosemide (LASIX) 20 MG tablet; Take 1 tablet (20 mg total) by mouth daily.  Medication management -     COMPLETE METABOLIC PANEL WITH GFR -     Lithium level  Bilateral lower extremity edema -     furosemide (LASIX) 20 MG tablet; Take 1 tablet (20 mg total) by mouth daily.   .. Depression screen [facility] 2/9 02/06/2018 08/08/2017 01/31/2017  Decreased Interest 0 0 0  Down, Depressed, Hopeless 0 0 0  PHQ - 2 Score 0 0 0   BP on 2nd recheck is good. Keep on same medication. Consider taking last dose of hydralazine and metoprolol before dinner.   Labs ordered for medication management.   Follow up in 6 months.

## 2018-07-25 LAB — LITHIUM LEVEL: Lithium Lvl: 0.8 mmol/L (ref 0.6–1.2)

## 2018-07-25 LAB — COMPLETE METABOLIC PANEL WITH GFR
AG Ratio: 1.4 (calc) (ref 1.0–2.5)
ALBUMIN MSPROF: 4.3 g/dL (ref 3.6–5.1)
ALKALINE PHOSPHATASE (APISO): 130 U/L — AB (ref 33–115)
ALT: 23 U/L (ref 6–29)
AST: 18 U/L (ref 10–35)
BILIRUBIN TOTAL: 0.3 mg/dL (ref 0.2–1.2)
BUN: 12 mg/dL (ref 7–25)
CO2: 27 mmol/L (ref 20–32)
Calcium: 10.2 mg/dL (ref 8.6–10.2)
Chloride: 104 mmol/L (ref 98–110)
Creat: 0.87 mg/dL (ref 0.50–1.10)
GFR, Est African American: 92 mL/min/{1.73_m2} (ref >=60)
GFR, Est Non African American: 79 mL/min/{1.73_m2} (ref >=60)
GLOBULIN: 3 g/dL (ref 1.9–3.7)
GLUCOSE: 106 mg/dL (ref 65–139)
Potassium: 4.2 mmol/L (ref 3.5–5.3)
SODIUM: 143 mmol/L (ref 135–146)
Total Protein: 7.3 g/dL (ref 6.1–8.1)

## 2018-07-25 NOTE — Progress Notes (Signed)
Call pt: lithium level stable. One liver enzyme a little elevated not concerning at this time. Recheck in 3 months.

## 2018-07-29 ENCOUNTER — Encounter: Payer: Self-pay | Admitting: *Deleted

## 2018-08-07 ENCOUNTER — Ambulatory Visit (INDEPENDENT_AMBULATORY_CARE_PROVIDER_SITE_OTHER): Payer: Self-pay | Admitting: Physician Assistant

## 2018-08-07 ENCOUNTER — Encounter: Payer: Self-pay | Admitting: Physician Assistant

## 2018-08-07 VITALS — BP 132/80 | HR 54 | Ht 61.0 in | Wt 174.0 lb

## 2018-08-07 DIAGNOSIS — I10 Essential (primary) hypertension: Secondary | ICD-10-CM

## 2018-08-07 DIAGNOSIS — Z23 Encounter for immunization: Secondary | ICD-10-CM

## 2018-08-07 DIAGNOSIS — R748 Abnormal levels of other serum enzymes: Secondary | ICD-10-CM

## 2018-08-07 DIAGNOSIS — F3170 Bipolar disorder, currently in remission, most recent episode unspecified: Secondary | ICD-10-CM

## 2018-08-07 MED ORDER — METOPROLOL TARTRATE 100 MG PO TABS: 100.0000 mg | ORAL_TABLET | Freq: Two times a day (BID) | ORAL | 1 refills | Status: DC

## 2018-08-07 MED ORDER — HYDRALAZINE HCL 100 MG PO TABS: 100.0000 mg | ORAL_TABLET | Freq: Three times a day (TID) | ORAL | 1 refills | Status: DC

## 2018-08-07 NOTE — Progress Notes (Signed)
   Subjective:    Patient ID: Christine Cisneros, female    DOB: [birth year], 47 y.o.   MRN: [id number]  HPI  Pt is a 47 yo female with HTN, Bipolar, hypertriglyceridemia who presents to the clinic for 6 month follow up.   Over all she is doing well. She is checking her BP at home and staying under 130's over 80's. No CP, palpitations, headaches or vision changes. Pt takes hydralazine and metoprolol daily.   Pt is managed by [facility] for bipolar but we check lithium level.   Last labs her liver enzyme was elevated. She needs it rechecked.   .. Active Ambulatory Problems    Diagnosis Date Noted  . Bipolar disorder ([place]) 03/11/2015  . Essential hypertension, benign 03/11/2015  . Hypertriglyceridemia 08/03/2016  . BMI 30.0-30.9,adult 08/10/2017   Resolved Ambulatory Problems    Diagnosis Date Noted  . No Resolved Ambulatory Problems   Past Medical History:  Diagnosis Date  . Bipolar 1 disorder ([place])   . Hypertension     Review of Systems See HPI.     Objective:   Physical Exam  Constitutional: She is oriented to person, place, and time. She appears well-developed and well-nourished.  HENT:  Head: Normocephalic and atraumatic.  Cardiovascular: Normal rate and regular rhythm.  Pulmonary/Chest: Effort normal and breath sounds normal.  Neurological: She is alert and oriented to person, place, and time.  Psychiatric: She has a normal mood and affect. Her behavior is normal.          Assessment & Plan:  Marland KitchenMarland KitchenDiagnoses and all orders for this visit:  Essential hypertension, benign -     hydrALAZINE (APRESOLINE) 100 MG tablet; Take 1 tablet (100 mg total) by mouth 3 (three) times daily. -     metoprolol tartrate (LOPRESSOR) 100 MG tablet; Take 1 tablet (100 mg total) by mouth 2 (two) times daily.  Bipolar affective disorder in remission (HCC)  Elevated alkaline phosphatase level -     Hepatic function panel  Need for Tdap vaccination -     Tdap vaccine greater than or equal to 7yo  IM  Need for immunization against influenza -     Flu Vaccine QUAD 36+ mos IM   BP looks good on 2nd recheck. Refilled medications.   Alk phos is just slightly elevated. Hold any tylenol, alcohol and have rechecked.   Bipolar controlled.

## 2018-08-10 ENCOUNTER — Other Ambulatory Visit: Payer: Self-pay

## 2018-08-10 DIAGNOSIS — R600 Localized edema: Secondary | ICD-10-CM

## 2018-08-10 DIAGNOSIS — I10 Essential (primary) hypertension: Secondary | ICD-10-CM

## 2018-08-10 MED ORDER — FUROSEMIDE 20 MG PO TABS: 20.0000 mg | ORAL_TABLET | Freq: Every day | ORAL | 3 refills | Status: DC

## 2018-08-21 ENCOUNTER — Ambulatory Visit (INDEPENDENT_AMBULATORY_CARE_PROVIDER_SITE_OTHER): Payer: Self-pay | Admitting: Psychiatry

## 2018-08-21 DIAGNOSIS — F319 Bipolar disorder, unspecified: Secondary | ICD-10-CM

## 2018-08-21 NOTE — Progress Notes (Signed)
[facility]  Patient ID: Christine Cisneros,  MRN: [id number]  PCP: Jomarie Longs, PA-C  Date of Evaluation: 08/21/2018 Time spent:25 minutes   HISTORY/CURRENT STATUS: HPI  Mood stable.  Sleep stable.  Good response to meds.  Tolerating.  Good job function back at her old job.  Individual Medical History/ Review of Systems: Changes? :Yes PE per pcp.  Allergies: Norvasc [amlodipine besylate]  Current Medications:  Current Outpatient Medications:  .  carbamazepine (TEGRETOL) 200 MG tablet, Take 200 mg by mouth 4 (four) times daily., Disp: , Rfl:  .  cholecalciferol (VITAMIN D) 1000 UNITS tablet, Take 2,000 Units by mouth daily. , Disp: , Rfl:  .  hydrALAZINE (APRESOLINE) 100 MG tablet, Take 1 tablet (100 mg total) by mouth 3 (three) times daily., Disp: 270 tablet, Rfl: 1 .  lithium carbonate (LITHOBID) 300 MG CR tablet, Take 600 mg by mouth 2 (two) times daily., Disp: , Rfl: 0 .  metoprolol tartrate (LOPRESSOR) 100 MG tablet, Take 1 tablet (100 mg total) by mouth 2 (two) times daily., Disp: 180 tablet, Rfl: 1 .  Multiple Vitamin (MULTIVITAMIN) tablet, Take 1 tablet by mouth daily., Disp: , Rfl:  .  calcium carbonate 200 MG capsule, Take 600 mg by mouth daily. , Disp: , Rfl:  .  furosemide (LASIX) 20 MG tablet, Take 1 tablet (20 mg total) by mouth daily. (Patient not taking: Reported on 08/21/2018), Disp: 90 tablet, Rfl: 3 Medication Side Effects: None  Family Medical/ Social History: Changes? 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 Htn.  MENTAL HEALTH EXAM:  There were no vitals taken for this visit.There is no height or weight on file to calculate BMI.  General Appearance: Casual  Eye Contact:  Good  Speech:  Normal Rate  Volume:  Normal  Mood:  Euthymic  Affect:  Full Range  Thought Process:  Coherent, Goal Directed and Linear  Orientation:  Full (Time, Place, and Person)  Thought Content: WDL   Suicidal Thoughts:  No   Homicidal Thoughts:  No  Memory:  Recent  Judgement:  Intact  Insight:  Good  Psychomotor Activity:  Normal  Concentration:  Concentration: Good  Recall:  Good  Fund of Knowledge: Good  Language: Good  Akathisia:  No  AIMS (if indicated): not done  Assets:  Communication Skills Desire for Improvement Social Support Vocational/Educational  ADL's:  Intact  Cognition: WNL  Prognosis:  Good   Lab Results  Component Value Date   LITHIUM 0.8 07/24/2018   NA 143 07/24/2018   BUN 12 07/24/2018   CREATININE 0.87 07/24/2018   TSH 3.23 08/02/2016   WBC 8.5 08/02/2016     Chemistry      Component Value Date/Time   NA 143 07/24/2018 0810   K 4.2 07/24/2018 0810   CL 104 07/24/2018 0810   CO2 27 07/24/2018 0810   BUN 12 07/24/2018 0810   CREATININE 0.87 07/24/2018 0810      Component Value Date/Time   CALCIUM 10.2 07/24/2018 0810   ALKPHOS 131 (H) 03/17/2015 0920   AST 18 07/24/2018 0810   ALT 23 07/24/2018 0810   BILITOT 0.3 07/24/2018 0810      DIAGNOSES:    ICD-10-CM   1. Bipolar I disorder (HCC) F31.9     RECOMMENDATIONS:  Stable lithium level.  No changes in meds. Discussed signs and symbptoms of lithium and carbamazepine toxicity.  She has had these problems in the  past.  We discussed med interactions that could affect these levels. Discussed slightly abnl liver enzyme.  Unlikely related to psych meds. Wanted to discuss how to talk to 17 yo son about bipolar disorder.  Gave advice on this. 22 min psychotherapy re: this and stigma etc.  Lauraine Rinne, MD

## 2018-09-08 ENCOUNTER — Other Ambulatory Visit: Payer: Self-pay | Admitting: Physician Assistant

## 2018-09-08 DIAGNOSIS — Z1231 Encounter for screening mammogram for malignant neoplasm of breast: Secondary | ICD-10-CM

## 2018-09-18 ENCOUNTER — Ambulatory Visit (HOSPITAL_BASED_OUTPATIENT_CLINIC_OR_DEPARTMENT_OTHER)
Admission: RE | Admit: 2018-09-18 | Discharge: 2018-09-18 | Disposition: A | Discharge status: Home / Self Care | Payer: Self-pay | Source: Ambulatory Visit | Attending: Physician Assistant | Admitting: Physician Assistant

## 2018-09-18 ENCOUNTER — Encounter (HOSPITAL_BASED_OUTPATIENT_CLINIC_OR_DEPARTMENT_OTHER): Payer: Self-pay

## 2018-09-18 DIAGNOSIS — Z1231 Encounter for screening mammogram for malignant neoplasm of breast: Secondary | ICD-10-CM | POA: Insufficient documentation

## 2018-09-18 NOTE — Progress Notes (Signed)
Call pt: normal mammogram. Follow up in 1 year.

## 2018-10-10 ENCOUNTER — Other Ambulatory Visit: Payer: Self-pay | Admitting: Psychiatry

## 2018-12-08 ENCOUNTER — Encounter: Payer: Self-pay | Admitting: Emergency Medicine

## 2019-02-08 ENCOUNTER — Other Ambulatory Visit: Payer: Self-pay | Admitting: Physician Assistant

## 2019-02-08 DIAGNOSIS — I10 Essential (primary) hypertension: Secondary | ICD-10-CM

## 2019-02-19 ENCOUNTER — Encounter: Payer: Self-pay | Admitting: Psychiatry

## 2019-02-19 ENCOUNTER — Other Ambulatory Visit: Payer: Self-pay

## 2019-02-19 ENCOUNTER — Ambulatory Visit (INDEPENDENT_AMBULATORY_CARE_PROVIDER_SITE_OTHER): Payer: Self-pay | Admitting: Psychiatry

## 2019-02-19 DIAGNOSIS — F319 Bipolar disorder, unspecified: Secondary | ICD-10-CM

## 2019-02-19 MED ORDER — LITHIUM CARBONATE ER 300 MG PO TBCR: 600.0000 mg | EXTENDED_RELEASE_TABLET | Freq: Two times a day (BID) | ORAL | 1 refills | Status: DC

## 2019-02-19 MED ORDER — CARBAMAZEPINE 200 MG PO TABS: 200.0000 mg | ORAL_TABLET | Freq: Four times a day (QID) | ORAL | 1 refills | Status: DC

## 2019-02-19 NOTE — Progress Notes (Signed)
Christine Cisneros [id number] [birth year] 48 y.o.  Subjective:   Patient ID:  Christine Cisneros is a 48 y.o. (DOB [birth year]) female.  Chief Complaint:  Chief Complaint  Patient presents with  . Follow-up    med managment    HPI Christine Cisneros presents to the office today for follow-up of bipolar disorder.    Last seen August 21, 2018.  Lithium level was stable and no med changes were made.  Patient reports stable mood and denies depressed or irritable moods.  No mania.  Patient denies any recent difficulty with anxiety.  Patient denies difficulty with sleep initiation or maintenance. Denies appetite disturbance.  Patient reports that energy and motivation have been good.  Patient denies any difficulty with concentration.  Patient denies any suicidal ideation.   Past Psychiatric Medication Trials: Lithium 1200 daily, carbamazepine 400 twice daily, olanzapine, Geodon no response, Depakote weight gain and tired, Abilify 15, Ambien  Review of Systems:  Review of Systems  HENT: Positive for ear pain.   Neurological: Negative for tremors and weakness.    Medications: I have reviewed the patient's current medications.  Current Outpatient Medications  Medication Sig Dispense Refill  . calcium carbonate 200 MG capsule Take 600 mg by mouth daily.     . carbamazepine (TEGRETOL) 200 MG tablet Take 1 tablet (200 mg total) by mouth 4 (four) times daily. 360 tablet 1  . cholecalciferol (VITAMIN D) 1000 UNITS tablet Take 2,000 Units by mouth daily.     . hydrALAZINE (APRESOLINE) 100 MG tablet Take 1 tablet (100 mg total) by mouth 3 (three) times daily. 270 tablet 1  . lithium carbonate (LITHOBID) 300 MG CR tablet Take 2 tablets (600 mg total) by mouth 2 (two) times daily. 360 tablet 1  . metoprolol tartrate (LOPRESSOR) 100 MG tablet Take 1 tablet by mouth twice daily 180 tablet 0  . Multiple Vitamin (MULTIVITAMIN) tablet Take 1 tablet by mouth daily.    . furosemide (LASIX) 20 MG tablet Take 1 tablet (20 mg  total) by mouth daily. (Patient not taking: Reported on 08/21/2018) 90 tablet 3   No current facility-administered medications for this visit.     Medication Side Effects: None  Allergies:  Allergies  Allergen Reactions  . Norvasc [Amlodipine Besylate]     Nausea/SOB/chest tightness    Past Medical History:  Diagnosis Date  . Bipolar 1 disorder (HCC)   . Hypertension     Family History  Problem Relation Age of Onset  . Cancer Father        melanoma  . Hyperlipidemia Father   . Diabetes Maternal Grandfather   . Diabetes Paternal Grandmother     Social History   Socioeconomic History  . Marital status: Single    Spouse name: Not on file  . Number of children: Not on file  . Years of education: Not on file  . Highest education level: Not on file  Occupational History  . Occupation: Charity fundraiser  Social Needs  . Financial resource strain: Not on file  . Food insecurity:    Worry: Not on file    Inability: Not on file  . Transportation needs:    Medical: Not on file    Non-medical: Not on file  Tobacco Use  . Smoking status: Never Smoker  . Smokeless tobacco: Never Used  Substance and Sexual Activity  . Alcohol use: No    Alcohol/week: 0.0 standard drinks  . Drug use: No  . Sexual activity: Yes    Partners: Male  Lifestyle  . Physical activity:    Days per week: Not on file    Minutes per session: Not on file  . Stress: Not on file  Relationships  . Social connections:    Talks on phone: Not on file    Gets together: Not on file    Attends religious service: Not on file    Active member of club or organization: Not on file    Attends meetings of clubs or organizations: Not on file    Relationship status: Not on file  . Intimate partner violence:    Fear of current or ex partner: Not on file    Emotionally abused: Not on file    Physically abused: Not on file    Forced sexual activity: Not on file  Other Topics Concern  . Not on file  Social History  Narrative  . Not on file    Past Medical History, Surgical history, Social history, and Family history were reviewed and updated as appropriate.   Please see review of systems for further details on the patient's review from today.   Objective:   Physical Exam:  There were no vitals taken for this visit.  Physical Exam Neurological:     Mental Status: She is alert and oriented to person, place, and time.     Cranial Nerves: No dysarthria.  Psychiatric:        Attention and Perception: Attention normal.        Mood and Affect: Mood normal.        Speech: Speech normal.        Behavior: Behavior is cooperative.        Thought Content: Thought content normal. Thought content is not paranoid or delusional. Thought content does not include homicidal or suicidal ideation. Thought content does not include homicidal or suicidal plan.        Cognition and Memory: Cognition and memory normal.        Judgment: Judgment normal.     Comments: Insight good.     Lab Review:     Component Value Date/Time   NA 143 07/24/2018 0810   K 4.2 07/24/2018 0810   CL 104 07/24/2018 0810   CO2 27 07/24/2018 0810   GLUCOSE 106 07/24/2018 0810   BUN 12 07/24/2018 0810   CREATININE 0.87 07/24/2018 0810   CALCIUM 10.2 07/24/2018 0810   PROT 7.3 07/24/2018 0810   ALBUMIN 4.6 03/17/2015 0920   AST 18 07/24/2018 0810   ALT 23 07/24/2018 0810   ALKPHOS 131 (H) 03/17/2015 0920   BILITOT 0.3 07/24/2018 0810   GFRNONAA 79 07/24/2018 0810   GFRAA 92 07/24/2018 0810       Component Value Date/Time   WBC 8.5 08/02/2016   WBC 10.6 (H) 03/17/2015 0920   RBC 4.47 03/17/2015 0920   HGB 13.5 08/02/2016   HCT 40 08/02/2016   PLT 321 08/02/2016   MCV 91.5 03/17/2015 0920   MCH 30.6 03/17/2015 0920   MCHC 33.5 03/17/2015 0920   RDW 12.5 03/17/2015 0920   LYMPHSABS 1.1 03/17/2015 0920   MONOABS 0.7 03/17/2015 0920   EOSABS 0.1 03/17/2015 0920   BASOSABS 0.0 03/17/2015 0920    Lithium  Date Value  Ref Range Status  08/02/2016 0.7  Final   Lithium Lvl  Date Value Ref Range Status  07/24/2018 0.8 0.6 - 1.2 mmol/L Final     No results found for: PHENYTOIN, PHENOBARB, VALPROATE, CBMZ   .res Assessment: Plan:  Bipolar I disorder (HCC) - Plan: Lithium level, carbamazepine (TEGRETOL) 200 MG tablet, lithium carbonate (LITHOBID) 300 MG CR tablet   No changes.  Her bipolar disorder has been relatively stable for several years.  She had a couple of severe manic episodes around 2006 and had to be hospitalized for 1 of them.  Need lithium level.  Counseled patient regarding potential benefits, risks, and side effects of lithium to include potential risk of lithium affecting thyroid and renal function.  Discussed need for periodic lab monitoring to determine drug level and to assess for potential adverse effects.  Counseled patient regarding signs and symptoms of lithium toxicity and advised that they notify office immediately or seek urgent medical attention if experiencing these signs and symptoms.  Patient advised to contact office with any questions or concerns.  FU 6 mos.  I connected with patient by a video enabled telemedicine application or telephone, with their informed consent, and verified patient privacy and that I am speaking with the correct person using two identifiers.  I was located at office and patient at home.   Meredith Staggers, MD, DFAPA  Please see After Visit Summary for patient specific instructions.  Future Appointments  Date Time Provider Department Center  08/06/2019  9:10 AM Jomarie Longs, PA-C [facility] None    Orders Placed This Encounter  Procedures  . Lithium level      -------------------------------

## 2019-05-07 ENCOUNTER — Other Ambulatory Visit: Payer: Self-pay

## 2019-05-07 DIAGNOSIS — F319 Bipolar disorder, unspecified: Secondary | ICD-10-CM

## 2019-05-07 MED ORDER — LITHIUM CARBONATE ER 300 MG PO TBCR: 600.0000 mg | EXTENDED_RELEASE_TABLET | Freq: Two times a day (BID) | ORAL | 1 refills | Status: DC

## 2019-05-08 ENCOUNTER — Other Ambulatory Visit: Payer: Self-pay

## 2019-05-08 DIAGNOSIS — I10 Essential (primary) hypertension: Secondary | ICD-10-CM

## 2019-05-08 MED ORDER — HYDRALAZINE HCL 100 MG PO TABS: 100.0000 mg | ORAL_TABLET | Freq: Three times a day (TID) | ORAL | 0 refills | Status: DC

## 2019-05-08 MED ORDER — METOPROLOL TARTRATE 100 MG PO TABS: 100.0000 mg | ORAL_TABLET | Freq: Two times a day (BID) | ORAL | 0 refills | Status: DC

## 2019-06-01 ENCOUNTER — Ambulatory Visit (INDEPENDENT_AMBULATORY_CARE_PROVIDER_SITE_OTHER): Payer: Self-pay | Admitting: Sports Medicine

## 2019-06-01 ENCOUNTER — Other Ambulatory Visit: Payer: Self-pay

## 2019-06-01 VITALS — BP 155/86 | HR 101 | Ht 61.0 in | Wt 175.0 lb

## 2019-06-01 DIAGNOSIS — R002 Palpitations: Secondary | ICD-10-CM

## 2019-06-01 NOTE — Assessment & Plan Note (Addendum)
Unclear etiology, likely dehydration but we are going to do a full work-up. ECG shows sinus tachycardia with some left axis deviation, I do not know if this is new. She has been taking her hydralazine and beta-blocker without any missed dosages. Due to longstanding hypertension we are going to get an echocardiogram. Ultimately I think there are better options for her hypertension than hydralazine. Adding a CBC, CMP, BNP, d-dimer, TSH, lithium and carbamazepine levels. Patient will aggressively hydrate. If everything comes back negative we will likely get a long-term Holter monitor. I would like to see her back next week.

## 2019-06-01 NOTE — Progress Notes (Signed)
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  It resolved quickly, and she felt okay other than maybe a little bit tired.  No change in medications, she has been consistent with everything on her medical list, she has had a bit of increasing anxiety lately.  She is able to walk up 2-3 flights of stairs rapidly without any chest pain, shortness of breath.  I reviewed the past medical history, family history, social history, surgical history, and allergies today and no changes were needed.  Please see the problem list section below in epic for further details.  Past Medical History: Past Medical History:  Diagnosis Date  . Bipolar 1 disorder (HCC)   . Hypertension    Past Surgical History: No past surgical history on file. Social History: Social History   Socioeconomic History  . Marital status: Single    Spouse name: Not on file  . Number of children: Not on file  . Years of education: Not on file  . Highest education level: Not on file  Occupational History  . 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 Financial resource strain: Not on file  . Food insecurity    Worry: Not on file    Inability: Not on file  . Transportation needs    Medical: Not on file    Non-medical: Not on file  Tobacco Use  . Smoking status: Never Smoker  . Smokeless tobacco: Never Used  Substance and Sexual Activity  . Alcohol use: No    Alcohol/week: 0.0 standard drinks  . Drug use: No  . Sexual activity: Yes    Partners: Male  Lifestyle  . Physical activity    Days per week: Not on file    Minutes per session: Not on file  . Stress: Not on file  Relationships  . Social Musicianconnections    Talks on phone: Not on file    Gets together: Not on  file    Attends religious service: Not on file    Active member of club or organization: Not on file    Attends meetings of clubs or organizations: Not on file    Relationship status: Not on file  Other Topics Concern  . Not on file  Social History Narrative  . Not on file   Family History: Family History  Problem Relation Age of Onset  . Cancer Father        melanoma  . Hyperlipidemia Father   . Diabetes Maternal Grandfather   . Diabetes Paternal Grandmother    Allergies: Allergies  Allergen Reactions  . Norvasc [Amlodipine Besylate]     Nausea/SOB/chest tightness   Medications: See med rec.  Review of Systems: No fevers, chills, night sweats, weight loss, chest pain, or shortness of breath.   Objective:    General: Well Developed, well nourished, and in no acute distress.  Neuro: Alert and oriented x3, extra-ocular muscles intact, sensation grossly intact.  HEENT: Normocephalic, atraumatic, pupils equal round reactive to light, neck supple, no masses, no lymphadenopathy, thyroid nonpalpable.  Skin: Warm and dry, no rashes. Cardiac: Regular rhythm, slightly tachycardic, no murmurs rubs or gallops, no lower extremity edema.  Respiratory: Clear to auscultation bilaterally. Not using accessory muscles, speaking in full sentences.  Twelve-lead ECG personally  reviewed, sinus tachycardia, left axis deviation.  No ST changes, no PR changes.  Impression and Recommendations:    Palpitations Unclear etiology, likely dehydration but we are going to do a full work-up. ECG shows sinus tachycardia with some left axis deviation, I do not know if this is new. She has been taking her hydralazine and beta-blocker without any missed dosages. Due to longstanding hypertension we are going to get an echocardiogram. Ultimately I think there are better options for her hypertension than hydralazine. Adding a CBC, CMP, BNP, d-dimer, TSH, lithium and carbamazepine levels. Patient will  aggressively hydrate. If everything comes back negative we will likely get a long-term Holter monitor. I would like to see her back next week.   ___________________________________________ Gwen Her. Dianah Field, M.D., ABFM., CAQSM. Primary Care and Sports Medicine [facility]  Adjunct Professor of [employer]

## 2019-06-05 LAB — CARBAMAZEPINE, FREE AND TOTAL
CARBAMAZEPINE METABOLITE, FREE: 1.5 ug/mL — ABNORMAL HIGH (ref 0.1–1.0)
CARBAMAZEPINE METABOLITE, TOTAL: 3.1 ug/mL — ABNORMAL HIGH (ref 0.2–2.0)
CARBAMAZEPINE, TOTAL: 11.1 ug/mL (ref 4.0–12.0)
Carbamazepine Metabolite/: 1.6 ug/mL
Carbamazepine, Bound: 9 ug/mL
Carbamazepine, Free: 2.1 ug/mL (ref 1.0–3.0)

## 2019-06-05 LAB — CBC WITH DIFFERENTIAL/PLATELET
Absolute Monocytes: 717 cells/uL (ref 200–950)
Basophils Absolute: 20 cells/uL (ref 0–200)
Basophils Relative: 0.2 %
Eosinophils Absolute: 61 cells/uL (ref 15–500)
Eosinophils Relative: 0.6 %
HCT: 40.3 % (ref 35.0–45.0)
Hemoglobin: 13.8 g/dL (ref 11.7–15.5)
Lymphs Abs: 1091 cells/uL (ref 850–3900)
MCH: 31.7 pg (ref 27.0–33.0)
MCHC: 34.2 g/dL (ref 32.0–36.0)
MCV: 92.4 fL (ref 80.0–100.0)
MPV: 10 fL (ref 7.5–12.5)
Monocytes Relative: 7.1 %
Neutro Abs: 8211 cells/uL — ABNORMAL HIGH (ref 1500–7800)
Neutrophils Relative %: 81.3 %
Platelets: 308 10*3/uL (ref 140–400)
RBC: 4.36 10*6/uL (ref 3.80–5.10)
RDW: 12.2 % (ref 11.0–15.0)
Total Lymphocyte: 10.8 %
WBC: 10.1 10*3/uL (ref 3.8–10.8)

## 2019-06-05 LAB — COMPLETE METABOLIC PANEL WITH GFR
AG Ratio: 1.7 (calc) (ref 1.0–2.5)
ALT: 29 U/L (ref 6–29)
AST: 18 U/L (ref 10–35)
Albumin: 4.9 g/dL (ref 3.6–5.1)
Alkaline phosphatase (APISO): 105 U/L (ref 31–125)
BUN: 18 mg/dL (ref 7–25)
CO2: 22 mmol/L (ref 20–32)
Calcium: 9.8 mg/dL (ref 8.6–10.2)
Chloride: 104 mmol/L (ref 98–110)
Creat: 0.87 mg/dL (ref 0.50–1.10)
GFR, Est African American: 91 mL/min/{1.73_m2} (ref >=60)
GFR, Est Non African American: 79 mL/min/{1.73_m2} (ref >=60)
Globulin: 2.9 g/dL (calc) (ref 1.9–3.7)
Glucose, Bld: 103 mg/dL — ABNORMAL HIGH (ref 65–99)
Potassium: 3.8 mmol/L (ref 3.5–5.3)
Sodium: 138 mmol/L (ref 135–146)
Total Bilirubin: 0.2 mg/dL (ref 0.2–1.2)
Total Protein: 7.8 g/dL (ref 6.1–8.1)

## 2019-06-05 LAB — LITHIUM LEVEL: Lithium Lvl: 0.6 mmol/L (ref 0.6–1.2)

## 2019-06-05 LAB — BRAIN NATRIURETIC PEPTIDE: Brain Natriuretic Peptide: 9 pg/mL (ref <100)

## 2019-06-05 LAB — TROPONIN I: Troponin I: <0.01 ng/mL (ref <=0.0)

## 2019-06-05 LAB — CK TOTAL AND CKMB (NOT AT ARMC)
CK, MB: <0.7 ng/mL (ref 0–5.0)
Total CK: 45 U/L (ref 29–143)

## 2019-06-05 LAB — D-DIMER, QUANTITATIVE: D-Dimer, Quant: 0.2 mcg/mL FEU (ref <0.50)

## 2019-06-05 LAB — TSH: TSH: 1.3 mIU/L

## 2019-06-06 ENCOUNTER — Telehealth: Payer: Self-pay | Admitting: Psychiatry

## 2019-06-06 NOTE — Telephone Encounter (Signed)
Pt called to advise, She had heart palpitations, went to PCP as work in. Got labs back. Would like you to look and advise if any changes needed. Dr. Aundria Mems was doctor to order labs. In [facility] network so you can look in system at results.

## 2019-06-08 ENCOUNTER — Telehealth: Payer: Self-pay | Admitting: Psychiatry

## 2019-06-08 NOTE — Telephone Encounter (Signed)
TC noted.

## 2019-06-08 NOTE — Telephone Encounter (Signed)
RTC  She went to doctor last Friday  DT heart palpitations.  Question RE: CBZ blood level.  Slightly elevated but labs drawn about 9 hours after last dosage.  No recent CBZ dose changes.    No SE CBZ otherwise.  No SE lithium.  Disc BP meds and lithium.  Avoid thiazide diuretics if possible.  If add others let us know and we'll check a level and adjust as needed.    Counseled patient regarding potential benefits, risks, and side effects of lithium to include potential risk of lithium affecting thyroid and renal function.  Discussed need for periodic lab monitoring to determine drug level and to assess for potential adverse effects.  Counseled patient regarding signs and symptoms of lithium toxicity and advised that they notify office immediately or seek urgent medical attention if experiencing these signs and symptoms.  Patient advised to contact office with any questions or concerns.  FU as scheduled.  No med changes indicated.  Lynder Parents, MD, DFAPA

## 2019-06-18 ENCOUNTER — Ambulatory Visit (INDEPENDENT_AMBULATORY_CARE_PROVIDER_SITE_OTHER): Payer: Self-pay | Admitting: Sports Medicine

## 2019-06-18 ENCOUNTER — Other Ambulatory Visit: Payer: Self-pay

## 2019-06-18 ENCOUNTER — Encounter: Payer: Self-pay | Admitting: Sports Medicine

## 2019-06-18 DIAGNOSIS — I10 Essential (primary) hypertension: Secondary | ICD-10-CM

## 2019-06-18 DIAGNOSIS — R002 Palpitations: Secondary | ICD-10-CM

## 2019-06-18 MED ORDER — LISINOPRIL 20 MG PO TABS: 20.0000 mg | ORAL_TABLET | Freq: Every day | ORAL | 3 refills | Status: DC

## 2019-06-18 NOTE — Assessment & Plan Note (Signed)
Blood pressure has improved however we are going to overall a whole regimen, she does desire to stop the hydralazine. Discontinue hydralazine, discontinue furosemide. Continue metoprolol. Adding lisinopril 20 mg daily. Return in 2 weeks to recheck. Because of her lithium her psychiatrist has advised that we avoid thiazide diuretics. Continue to avoid adding sodium to food. If persistently elevated blood pressure at the follow-up visit we will consider switching from lisinopril to [place].

## 2019-06-18 NOTE — Progress Notes (Signed)
Subjective:    CC: Follow-up  HPI: Hypertension: Under better control, the patient does desire to get rid of her hydralazine.  Palpitation: For the most part resolved.  I reviewed the past medical history, family history, social history, surgical history, and allergies today and no changes were needed.  Please see the problem list section below in epic for further details.  Past Medical History: Past Medical History:  Diagnosis Date  . Bipolar 1 disorder ([place])   . Hypertension    Past Surgical History: No past surgical history on file. Social History: Social History   Socioeconomic History  . Marital status: Single    Spouse name: Not on file  . Number of children: Not on file  . Years of education: Not on file  . Highest education level: Not on file  Occupational History  . Occupation: Therapist, sports  Social Needs  . Financial resource strain: Not on file  . Food insecurity    Worry: Not on file    Inability: Not on file  . Transportation needs    Medical: Not on file    Non-medical: Not on file  Tobacco Use  . Smoking status: Never Smoker  . Smokeless tobacco: Never Used  Substance and Sexual Activity  . Alcohol use: No    Alcohol/week: 0.0 standard drinks  . Drug use: No  . Sexual activity: Yes    Partners: Male  Lifestyle  . Physical activity    Days per week: Not on file    Minutes per session: Not on file  . Stress: Not on file  Relationships  . Social Herbalist on phone: Not on file    Gets together: Not on file    Attends religious service: Not on file    Active member of club or organization: Not on file    Attends meetings of clubs or organizations: Not on file    Relationship status: Not on file  Other Topics Concern  . Not on file  Social History Narrative  . Not on file   Family History: Family History  Problem Relation Age of Onset  . Cancer Father        melanoma  . Hyperlipidemia Father   . Diabetes Maternal Grandfather   .  Diabetes Paternal Grandmother    Allergies: Allergies  Allergen Reactions  . Norvasc [Amlodipine Besylate]     Nausea/SOB/chest tightness   Medications: See med rec.  Review of Systems: No fevers, chills, night sweats, weight loss, chest pain, or shortness of breath.   Objective:    General: Well Developed, well nourished, and in no acute distress.  Neuro: Alert and oriented x3, extra-ocular muscles intact, sensation grossly intact.  HEENT: Normocephalic, atraumatic, pupils equal round reactive to light, neck supple, no masses, no lymphadenopathy, thyroid nonpalpable.  Skin: Warm and dry, no rashes. Cardiac: Regular rate and rhythm, no murmurs rubs or gallops, no lower extremity edema.  Respiratory: Clear to auscultation bilaterally. Not using accessory muscles, speaking in full sentences.  Impression and Recommendations:    Essential hypertension, benign Blood pressure has improved however we are going to overall a whole regimen, she does desire to stop the hydralazine. Discontinue hydralazine, discontinue furosemide. Continue metoprolol. Adding lisinopril 20 mg daily. Return in 2 weeks to recheck. Because of her lithium her psychiatrist has advised that we avoid thiazide diuretics. Continue to avoid adding sodium to food. If persistently elevated blood pressure at the follow-up visit we will consider switching from lisinopril to  [place].  Palpitations Extensive laboratory work-up was for the most part unremarkable, still awaiting echocardiogram, holding off on Holter monitoring for now, she typically gets palpitations with her doses of hydralazine, so we are discontinuing hydralazine for now.  I spent 25 minutes with this patient, greater than 50% was face-to-face time counseling regarding the above diagnoses.  ___________________________________________ Ihor Austinhomas J. Benjamin Stainhekkekandam, M.D., ABFM., CAQSM. Primary Care and Sports Medicine [facility]  Adjunct  Professor of Family Medicine  [employer]

## 2019-06-18 NOTE — Assessment & Plan Note (Signed)
Extensive laboratory work-up was for the most part unremarkable, still awaiting echocardiogram, holding off on Holter monitoring for now, she typically gets palpitations with her doses of hydralazine, so we are discontinuing hydralazine for now.

## 2019-07-02 ENCOUNTER — Other Ambulatory Visit: Payer: Self-pay

## 2019-07-02 ENCOUNTER — Ambulatory Visit (INDEPENDENT_AMBULATORY_CARE_PROVIDER_SITE_OTHER): Payer: Self-pay | Admitting: Sports Medicine

## 2019-07-02 ENCOUNTER — Encounter: Payer: Self-pay | Admitting: Sports Medicine

## 2019-07-02 DIAGNOSIS — I10 Essential (primary) hypertension: Secondary | ICD-10-CM

## 2019-07-02 NOTE — Assessment & Plan Note (Addendum)
At the last visit we overhauled her whole blood pressure regimen, we discontinued hydralazine, furosemide. We switched her to lisinopril with her metoprolol, due to her lithium treatment her psychiatrist has advised that we avoid thiazides. She has cut her sodium intake, blood pressure was well controlled, palpitations are gone, she had a single episode of blood pressure 99/60 but was asymptomatic, we will make no changes, return to see PCP as needed.

## 2019-07-02 NOTE — Progress Notes (Signed)
Subjective:    CC: Follow-up  HPI: Hypertension, palpitations: Well-controlled, see below.  I reviewed the past medical history, family history, social history, surgical history, and allergies today and no changes were needed.  Please see the problem list section below in epic for further details.  Past Medical History: Past Medical History:  Diagnosis Date  . Bipolar 1 disorder ([place])   . Hypertension    Past Surgical History: No past surgical history on file. Social History: Social History   Socioeconomic History  . Marital status: Single    Spouse name: Not on file  . Number of children: Not on file  . Years of education: Not on file  . Highest education level: Not on file  Occupational History  . Occupation: Therapist, sports  Social Needs  . Financial resource strain: Not on file  . Food insecurity    Worry: Not on file    Inability: Not on file  . Transportation needs    Medical: Not on file    Non-medical: Not on file  Tobacco Use  . Smoking status: Never Smoker  . Smokeless tobacco: Never Used  Substance and Sexual Activity  . Alcohol use: No    Alcohol/week: 0.0 standard drinks  . Drug use: No  . Sexual activity: Yes    Partners: Male  Lifestyle  . Physical activity    Days per week: Not on file    Minutes per session: Not on file  . Stress: Not on file  Relationships  . Social Herbalist on phone: Not on file    Gets together: Not on file    Attends religious service: Not on file    Active member of club or organization: Not on file    Attends meetings of clubs or organizations: Not on file    Relationship status: Not on file  Other Topics Concern  . Not on file  Social History Narrative  . Not on file   Family History: Family History  Problem Relation Age of Onset  . Cancer Father        melanoma  . Hyperlipidemia Father   . Diabetes Maternal Grandfather   . Diabetes Paternal Grandmother    Allergies: Allergies  Allergen Reactions  .  Norvasc [Amlodipine Besylate]     Nausea/SOB/chest tightness   Medications: See med rec.  Review of Systems: No fevers, chills, night sweats, weight loss, chest pain, or shortness of breath.   Objective:    General: Well Developed, well nourished, and in no acute distress.  Neuro: Alert and oriented x3, extra-ocular muscles intact, sensation grossly intact.  HEENT: Normocephalic, atraumatic, pupils equal round reactive to light, neck supple, no masses, no lymphadenopathy, thyroid nonpalpable.  Skin: Warm and dry, no rashes. Cardiac: Regular rate and rhythm, no murmurs rubs or gallops, no lower extremity edema.  Respiratory: Clear to auscultation bilaterally. Not using accessory muscles, speaking in full sentences.  Impression and Recommendations:    Essential hypertension, benign At the last visit we overhauled her whole blood pressure regimen, we discontinued hydralazine, furosemide. We switched her to lisinopril with her metoprolol, due to her lithium treatment her psychiatrist has advised that we avoid thiazides. She has cut her sodium intake, blood pressure was well controlled, palpitations are gone, she had a single episode of blood pressure 99/60 but was asymptomatic, we will make no changes, return to see PCP as needed.   ___________________________________________ Gwen Her. Dianah Field, M.D., ABFM., CAQSM. Primary Care and [facility]  Adjunct Professor of [employer]

## 2019-07-30 ENCOUNTER — Other Ambulatory Visit: Payer: Self-pay | Admitting: Physician Assistant

## 2019-07-30 DIAGNOSIS — I10 Essential (primary) hypertension: Secondary | ICD-10-CM

## 2019-08-06 ENCOUNTER — Ambulatory Visit (INDEPENDENT_AMBULATORY_CARE_PROVIDER_SITE_OTHER): Payer: Self-pay | Admitting: Physician Assistant

## 2019-08-06 ENCOUNTER — Encounter: Payer: Self-pay | Admitting: Physician Assistant

## 2019-08-06 ENCOUNTER — Other Ambulatory Visit: Payer: Self-pay

## 2019-08-06 VITALS — BP 134/80 | HR 56 | Ht 61.0 in | Wt 173.0 lb

## 2019-08-06 DIAGNOSIS — Z3009 Encounter for other general counseling and advice on contraception: Secondary | ICD-10-CM

## 2019-08-06 DIAGNOSIS — I10 Essential (primary) hypertension: Secondary | ICD-10-CM

## 2019-08-06 DIAGNOSIS — H1033 Unspecified acute conjunctivitis, bilateral: Secondary | ICD-10-CM

## 2019-08-06 MED ORDER — LOSARTAN POTASSIUM 50 MG PO TABS: 50.0000 mg | ORAL_TABLET | Freq: Every day | ORAL | 1 refills | Status: DC

## 2019-08-06 NOTE — Patient Instructions (Signed)
Dry Eye  Dry eye, also called keratoconjunctivitis sicca, is dryness of the membranes surrounding the eye. It happens when there are not enough healthy, natural tears in the eyes. The eyes must remain moist at all times. A small amount of tears is constantly produced by the tear glands (lacrimal glands). These glands are located under the outside part of the upper eyelids. Dry eye can happen on its own or be a symptom of several conditions, such as rheumatoid arthritis, lupus, or Sjgren's syndrome. Dry eye may be mild to severe. What are the causes? This condition may be caused by:  Not making enough tears (aqueous tear-deficient dry eyes).  Tears evaporating from the eyes too quickly (evaporative dry eyes). This is when there is an abnormality in the quality of your tears. This abnormality causes your tears to evaporate so quickly that the eyes cannot be kept moist. What increases the risk? You are more likely to develop this condition if you:  Are a woman, especially if you have gone through menopause.  Live in a dry climate.  Live in a dusty or smoky area.  Take certain medicines, such as: ? Anti-allergy medicines (antihistamines). ? Blood pressure medicines (antihypertensives). ? Birth control pills (oral contraceptives). ? Laxatives. ? Tranquilizers.  Have a history of refractive eye surgery, such as LASIK.  Have a history of long-term contact lens use. What are the signs or symptoms? Symptoms of this condition include:  Irritation.  Itchiness.  Redness.  Burning.  Inflammation of the eyelids.  Feeling as though something is stuck in the eye.  Light sensitivity.  Increased sensitivity and discomfort when wearing contact lenses.  Vision that varies throughout the day.  Occasional excessive tearing. How is this diagnosed? This condition is diagnosed based on your symptoms, your medical history, and an eye exam.  Your health care provider may look at your eye  using a microscope and may put dyes in your eye to check the health of the surface of your eye.  You may have tests, such as a test to evaluate your tear production (Schirmer test). ? During this test, a small strip of special paper is gently pressed into the inner corner of your eye. ? Your tear production is measured by how much of the paper is moistened by your tears during a set amount of time. You may be referred to a health care provider who specializes in eyes and eyesight (ophthalmologist). How is this treated? Treatment for this condition depends on the severity. Mild cases are often treated at home. To help relieve your symptoms, your health care provider may recommend eye drops, which are also called artificial tears.  If your condition is severe, treatment may include: ? Prescription eye drops. ? Over-the-counter or prescription ointments to moisten your eyes. ? Minor surgery to place plugs into the tear ducts. This keep tears from draining so that tears can stay on the surface of the eye longer. ? Medicines to reduce inflammation of the eyelids. ? Taking an omega-3 fatty acid nutritional supplement. Follow these instructions at home:  Take or apply over-the-counter and prescription medicines only as told by your health care provider. This includes eye drops.  If directed, apply a warm compress to your eyes to help reduce inflammation. Place a towel over your eyes and gently press the warm compress over your eyes for about 5 minutes, or as long as told by your health care provider.  Drink plenty of fluids to stay well hydrated.  If   possible, avoid dry, drafty environments.  Wear sunglasses when outdoors to protect your eyes from the sun and wind.  Use a humidifier at home to increase moisture in the air.  Remember to blink often when reading or using the computer for long periods.  If you wear contact lenses, remove them regularly to give your eyes a break. Always remove  contacts before sleeping.  Have a yearly eye exam and vision test.  Keep all follow-up visits as told by your health care provider. This is important. Contact a health care provider if:  You have eye pain.  You have pus-like fluid coming from your eye.  Your symptoms get worse or do not improve with treatment. Get help right away if:  Your vision suddenly changes. Summary  Dry eye is dryness of the membranes surrounding the eye.  Dry eye can happen on its own or be a symptom of several conditions, such as rheumatoid arthritis, lupus, or Sjgren's syndrome.  This condition is diagnosed based on your symptoms, your medical history, and an eye exam.  Treatment for this condition depends on the severity. Mild cases are often treated at home. To help relieve your symptoms, your health care provider may recommend eye drops, which are also called artificial tears. This information is not intended to replace advice given to you by your health care provider. Make sure you discuss any questions you have with your health care provider. Document Released: 09/18/2004 Document Revised: 04/25/2018 Document Reviewed: 04/25/2018 Elsevier Patient Education  2020 [employer].

## 2019-08-06 NOTE — Progress Notes (Deleted)
paraguard

## 2019-08-06 NOTE — Progress Notes (Addendum)
Subjective:    Patient ID: Christine Cisneros, female    DOB: [birth year], 48 y.o.   MRN: [id number]  HPI  Patient is a 48 y/o female with HTN that presents for a BP follow-up. She was recently put on lisinopril and metoprolol, states she no longer experiences palpitations. Denies any adverse effects and checks her BP about once a week. Her blood pressures have been great!  She also has the complaint of dry, red eyes. It started a few weeks ago, but she does not recall any environmental changes that may have triggered this; she states she may have bought new eye makeup. She denies any trauma to eye.  Describes it as if her eyes felt "glued shut" with tearing, but denies pain, pruritis, drainage, or pus. She states that it is uncomfortable at night; during the day, she hardly notices it until she looks in the mirror and can see how red her eyes are. She has tried OTC lubricating drops for little relief. She denies vision changes.   She also wanted to discuss birth control options. Previously, her partner had a vasectomy. She now has a new partner and wants contraception.    .. Active Ambulatory Problems    Diagnosis Date Noted  . Bipolar disorder ([place]) 03/11/2015  . Essential hypertension, benign 03/11/2015  . Hypertriglyceridemia 08/03/2016  . BMI 30.0-30.9,adult 08/10/2017  . Palpitations 06/01/2019   Resolved Ambulatory Problems    Diagnosis Date Noted  . No Resolved Ambulatory Problems   Past Medical History:  Diagnosis Date  . Bipolar 1 disorder ([place])   . Hypertension      Review of Systems  Constitutional: Negative for fever.  HENT: Negative for congestion, sinus pressure and sinus pain.   Eyes:       See HPI  Respiratory: Negative for shortness of breath.   Cardiovascular: Negative for chest pain and palpitations.      Objective:   Physical Exam Constitutional:      General: She is not in acute distress.    Appearance: Normal appearance.  HENT:     Head: Normocephalic  and atraumatic.  Eyes:     Extraocular Movements: Extraocular movements intact.     Pupils: Pupils are equal, round, and reactive to light.     Comments: Bilateral erythematous and injected conjunctivae. No discharge, crusting present.   Skin:    General: Skin is warm and dry.  Neurological:     Mental Status: She is alert and oriented to person, place, and time.  Psychiatric:        Mood and Affect: Mood normal.        Behavior: Behavior normal.    .. Today's Vitals   08/06/19 0906  BP: 134/80  Pulse: (!) 56  SpO2: 98%  Weight: 173 lb (78.5 kg)  Height: 5\' 1"  (1.549 m)   Body mass index is 32.69 kg/m.  [name].No results found for any visits on 08/06/19.     Assessment & Plan:   Marland KitchenMarland KitchenCamie was seen today for hypertension.  Diagnoses and all orders for this visit:  Acute conjunctivitis of both eyes, unspecified acute conjunctivitis type  Essential hypertension, benign -     losartan (COZAAR) 50 MG tablet; Take 1 tablet (50 mg total) by mouth daily.  Birth control counseling   BP is great in office and don't really want to change this up. There is a concern for medication causing the acute bilateral conjunctivitis since it started right after she made the switch.discussed  with patient I have never seen any side effect like this with ACE inhibitor. I do not see any evidence of viral/bacterial. Will try stopping all eye make up first. If no improvement in the next 3-4 days. Stop lisinopril and start cozaar.For now stay away from anything "drying" to eyes like anti-histamines. Continue to use lubricating eye drops. It is reassuring that she has NO vision changes. If that changes please alert me immediately. Obviously concern for uvetitis/scleritis rarer but more serious. Will follow closely. She just went to eye doctor and does not have insurance.    Pt will reach out via MyChart with any changes.   I think Nich[place] birth control option would be paraguard or BTL. She does not have  insurance so I think paraguard would be the [place]. Will price for her. OCP could increase BP and cause mood changes. As well as she is 48 and increased risk of blood clots. Progesterone cause irregular bleeding and bone density issues.   [name].Harlon Flor.I, Jade Breeback PA-C, have reviewed and agree with the above documentation in it's entirety.  [name]..Spent 30 minutes with patient and greater than 50 percent of visit spent counseling patient regarding treatment plan.

## 2019-08-07 ENCOUNTER — Telehealth: Payer: Self-pay | Admitting: Physician Assistant

## 2019-08-07 NOTE — Telephone Encounter (Signed)
Patient does not have insurance and interested in paraguard IUD can we price this self pay for her?

## 2019-08-07 NOTE — Telephone Encounter (Signed)
Please follow up today with patient. Any worsening eye pain use scale.  Any vision changes?  Any discharge?

## 2019-08-07 NOTE — Telephone Encounter (Signed)
Spoke with patient. She states never had eye pain (0/10) just redness. She states she can already tell they are better today. No d/c. No vision changes. [place].

## 2019-08-13 ENCOUNTER — Ambulatory Visit: Payer: Self-pay | Admitting: Physician Assistant

## 2019-08-20 ENCOUNTER — Ambulatory Visit (INDEPENDENT_AMBULATORY_CARE_PROVIDER_SITE_OTHER): Payer: Self-pay | Admitting: Psychiatry

## 2019-08-20 ENCOUNTER — Other Ambulatory Visit: Payer: Self-pay

## 2019-08-20 ENCOUNTER — Encounter: Payer: Self-pay | Admitting: Psychiatry

## 2019-08-20 DIAGNOSIS — F319 Bipolar disorder, unspecified: Secondary | ICD-10-CM

## 2019-08-20 NOTE — Progress Notes (Signed)
Christine Cisneros [id number] 02-12-1971 48 y.o.  Subjective:   Patient ID:  Christine Cisneros is a 47 y.o. (DOB 1971-06-16) female.  Chief Complaint:  Chief Complaint  Patient presents with  . Follow-up    med management and bipolar    HPI Christine Cisneros presents to the office today for follow-up of bipolar disorder.    Last seen February 19, 2019.[name]  Lithium level was ordered and no med changes were made.  She called July 22 reporting she had had palpitations and had a work-up by her physician.  Those labs were reviewed.  No med changes were made.    Family Covid free.  She's continued working and doing well.  Lisinopril managed BP.  Palpitations resolved.  Patient reports stable mood and denies depressed or irritable moods.  No mania.  Patient denies any recent difficulty with anxiety.  Patient denies difficulty with sleep initiation or maintenance. Denies appetite disturbance.  Patient reports that energy and motivation have been good.  Patient denies any difficulty with concentration.  Patient denies any suicidal ideation.  Past Psychiatric Medication Trials: Lithium 1200 daily, carbamazepine 400 twice daily, olanzapine, Geodon no response, Depakote weight gain and tired, Abilify 15, Ambien  Review of Systems:  Review of Systems  HENT: Negative for ear pain.   Cardiovascular: Negative for palpitations.  Neurological: Negative for tremors and weakness.    Medications: I have reviewed the patient's current medications.  Current Outpatient Medications  Medication Sig Dispense Refill  . calcium carbonate 200 MG capsule Take 600 mg by mouth daily.     . carbamazepine (TEGRETOL) 200 MG tablet Take 1 tablet (200 mg total) by mouth 4 (four) times daily. 360 tablet 1  . cholecalciferol (VITAMIN D) 1000 UNITS tablet Take 2,000 Units by mouth daily.     [name] lisinopril (ZESTRIL) 20 MG tablet Take 1 tablet (20 mg total) by mouth daily. 30 tablet 3  . lithium carbonate (LITHOBID) 300 MG CR tablet Take 2 tablets  (600 mg total) by mouth 2 (two) times daily. (Patient taking differently: Take 600 mg by mouth. 2 twice daily, except M, W, F 1 in the morning and 2 at night) 360 tablet 1  . losartan (COZAAR) 50 MG tablet Take 1 tablet (50 mg total) by mouth daily. 30 tablet 1  . metoprolol tartrate (LOPRESSOR) 100 MG tablet TAKE 1 TABLET BY MOUTH TWICE A DAY 180 tablet 0  . Multiple Vitamin (MULTIVITAMIN) tablet Take 1 tablet by mouth daily.     No current facility-administered medications for this visit.     Medication Side Effects: None  Allergies:  Allergies  Allergen Reactions  . Norvasc [Amlodipine Besylate]     Nausea/SOB/chest tightness    Past Medical History:  Diagnosis Date  . Bipolar 1 disorder ([place])   . Hypertension     Family History  Problem Relation Age of Onset  . Cancer Father        melanoma  . Hyperlipidemia Father   . Diabetes Maternal Grandfather   . Diabetes Paternal Grandmother     Social History   Socioeconomic History  . Marital status: Single    Spouse name: Not on file  . Number of children: Not on file  . Years of education: Not on file  . Highest education level: Not on file  Occupational History  . Occupation: Therapist, sports  Social Needs  . Financial resource strain: Not on file  . Food insecurity    Worry: Not on file  Inability: Not on file  . Transportation needs    Medical: Not on file    Non-medical: Not on file  Tobacco Use  . Smoking status: Never Smoker  . Smokeless tobacco: Never Used  Substance and Sexual Activity  . Alcohol use: No    Alcohol/week: 0.0 standard drinks  . Drug use: No  . Sexual activity: Yes    Partners: Male  Lifestyle  . Physical activity    Days per week: Not on file    Minutes per session: Not on file  . Stress: Not on file  Relationships  . Social Musicianconnections    Talks on phone: Not on file    Gets together: Not on file    Attends religious service: Not on file    Active member of club or organization: Not on  file    Attends meetings of clubs or organizations: Not on file    Relationship status: Not on file  . Intimate partner violence    Fear of current or ex partner: Not on file    Emotionally abused: Not on file    Physically abused: Not on file    Forced sexual activity: Not on file  Other Topics Concern  . Not on file  Social History Narrative  . Not on file    Past Medical History, Surgical history, Social history, and Family history were reviewed and updated as appropriate.   Please see review of systems for further details on the patient's review from today.   Objective:   Physical Exam:  There were no vitals taken for this visit.  Physical Exam Neurological:     Mental Status: She is alert and oriented to person, place, and time.     Cranial Nerves: No dysarthria.  Psychiatric:        Attention and Perception: Attention normal.        Mood and Affect: Mood normal.        Speech: Speech normal. Speech is not rapid and pressured or slurred.        Behavior: Behavior is cooperative.        Thought Content: Thought content normal. Thought content is not paranoid or delusional. Thought content does not include homicidal or suicidal ideation. Thought content does not include homicidal or suicidal plan.        Cognition and Memory: Cognition and memory normal.        Judgment: Judgment normal.     Comments: Insight good.     Lab Review:     Component Value Date/Time   NA 138 06/01/2019 1545   K 3.8 06/01/2019 1545   CL 104 06/01/2019 1545   CO2 22 06/01/2019 1545   GLUCOSE 103 (H) 06/01/2019 1545   BUN 18 06/01/2019 1545   CREATININE 0.87 06/01/2019 1545   CALCIUM 9.8 06/01/2019 1545   PROT 7.8 06/01/2019 1545   ALBUMIN 4.6 03/17/2015 0920   AST 18 06/01/2019 1545   ALT 29 06/01/2019 1545   ALKPHOS 131 (H) 03/17/2015 0920   BILITOT 0.2 06/01/2019 1545   GFRNONAA 79 06/01/2019 1545   GFRAA 91 06/01/2019 1545       Component Value Date/Time   WBC 10.1  06/01/2019 1545   RBC 4.36 06/01/2019 1545   HGB 13.8 06/01/2019 1545   HCT 40.3 06/01/2019 1545   PLT 308 06/01/2019 1545   MCV 92.4 06/01/2019 1545   MCH 31.7 06/01/2019 1545   MCHC 34.2 06/01/2019 1545   RDW 12.2 06/01/2019  1545   LYMPHSABS 1,091 06/01/2019 1545   MONOABS 0.7 03/17/2015 0920   EOSABS 61 06/01/2019 1545   BASOSABS 20 06/01/2019 1545    Lithium  Date Value Ref Range Status  08/02/2016 0.7  Final   Lithium Lvl  Date Value Ref Range Status  06/01/2019 0.6 0.6 - 1.2 mmol/L Final     No results found for: PHENYTOIN, PHENOBARB, VALPROATE, CBMZ   .res Assessment: Plan:    Bipolar I disorder (HCC) - Plan: Lithium level, Lithium level   No changes.  Her bipolar disorder has been relatively stable for several years.  She had a couple of severe manic episodes around 2006 and had to be hospitalized for 1 of them.  Doing well with CBZ and lithium.  Lithium level in July in low normal range. CMP July good also.  No changes indicated..  Counseled patient regarding potential benefits, risks, and side effects of lithium to include potential risk of lithium affecting thyroid and renal function.  Discussed need for periodic lab monitoring to determine drug level and to assess for potential adverse effects.  Counseled patient regarding signs and symptoms of lithium toxicity and advised that they notify office immediately or seek urgent medical attention if experiencing these signs and symptoms.  Patient advised to contact office with any questions or concerns.  Discussed that some blood pressure meds can affect lithium levels and cause him to go up.  It looks like what she is on is okay and she is not having any signs or symptoms of lithium toxicity at this time.  Repeat lithium level in January.  Don't want it to drop further.    FU 6 mos.  Meredith Staggers, MD, DFAPA  Please see After Visit Summary for patient specific instructions.  No future appointments.  Orders Placed  This Encounter  Procedures  . Lithium level  . Lithium level      -------------------------------

## 2019-09-11 ENCOUNTER — Other Ambulatory Visit: Payer: Self-pay

## 2019-09-11 DIAGNOSIS — I10 Essential (primary) hypertension: Secondary | ICD-10-CM

## 2019-09-11 MED ORDER — LISINOPRIL 20 MG PO TABS: 20.0000 mg | ORAL_TABLET | Freq: Every day | ORAL | 1 refills | Status: DC

## 2019-09-18 ENCOUNTER — Emergency Department
Admission: EM | Admit: 2019-09-18 | Discharge: 2019-09-18 | Disposition: A | Discharge status: Home / Self Care | Payer: Self-pay | Source: Home / Self Care | Attending: Emergency Medicine | Admitting: Emergency Medicine

## 2019-09-18 ENCOUNTER — Other Ambulatory Visit: Payer: Self-pay

## 2019-09-18 ENCOUNTER — Encounter (HOSPITAL_BASED_OUTPATIENT_CLINIC_OR_DEPARTMENT_OTHER): Payer: Self-pay | Admitting: *Deleted

## 2019-09-18 ENCOUNTER — Emergency Department (HOSPITAL_BASED_OUTPATIENT_CLINIC_OR_DEPARTMENT_OTHER): Payer: BLUE CROSS/BLUE SHIELD

## 2019-09-18 ENCOUNTER — Inpatient Hospital Stay (HOSPITAL_BASED_OUTPATIENT_CLINIC_OR_DEPARTMENT_OTHER)
Admission: EM | Admit: 2019-09-18 | Discharge: 2019-09-19 | DRG: 918 | Disposition: A | Discharge status: Home / Self Care | Payer: BLUE CROSS/BLUE SHIELD | Source: Ambulatory Visit | Attending: Internal Medicine | Admitting: Internal Medicine

## 2019-09-18 ENCOUNTER — Telehealth: Payer: Self-pay

## 2019-09-18 DIAGNOSIS — R260 Ataxic gait: Secondary | ICD-10-CM | POA: Diagnosis present

## 2019-09-18 DIAGNOSIS — Z833 Family history of diabetes mellitus: Secondary | ICD-10-CM

## 2019-09-18 DIAGNOSIS — F319 Bipolar disorder, unspecified: Secondary | ICD-10-CM | POA: Diagnosis present

## 2019-09-18 DIAGNOSIS — R42 Dizziness and giddiness: Secondary | ICD-10-CM | POA: Diagnosis present

## 2019-09-18 DIAGNOSIS — Z20828 Contact with and (suspected) exposure to other viral communicable diseases: Secondary | ICD-10-CM | POA: Diagnosis present

## 2019-09-18 DIAGNOSIS — Z8349 Family history of other endocrine, nutritional and metabolic diseases: Secondary | ICD-10-CM

## 2019-09-18 DIAGNOSIS — G251 Drug-induced tremor: Secondary | ICD-10-CM | POA: Diagnosis present

## 2019-09-18 DIAGNOSIS — R251 Tremor, unspecified: Secondary | ICD-10-CM

## 2019-09-18 DIAGNOSIS — Z808 Family history of malignant neoplasm of other organs or systems: Secondary | ICD-10-CM

## 2019-09-18 DIAGNOSIS — Z114 Encounter for screening for human immunodeficiency virus [HIV]: Secondary | ICD-10-CM

## 2019-09-18 DIAGNOSIS — T43591A Poisoning by other antipsychotics and neuroleptics, accidental (unintentional), initial encounter: Principal | ICD-10-CM | POA: Diagnosis present

## 2019-09-18 DIAGNOSIS — I10 Essential (primary) hypertension: Secondary | ICD-10-CM | POA: Diagnosis present

## 2019-09-18 DIAGNOSIS — T56891A Toxic effect of other metals, accidental (unintentional), initial encounter: Secondary | ICD-10-CM | POA: Diagnosis present

## 2019-09-18 DIAGNOSIS — R5383 Other fatigue: Secondary | ICD-10-CM | POA: Diagnosis present

## 2019-09-18 LAB — URINALYSIS, ROUTINE W REFLEX MICROSCOPIC
Bilirubin Urine: NEGATIVE
Glucose, UA: NEGATIVE mg/dL
Hgb urine dipstick: NEGATIVE
Ketones, ur: NEGATIVE mg/dL
Nitrite: NEGATIVE
Protein, ur: NEGATIVE mg/dL
Specific Gravity, Urine: 1.01 (ref 1.005–1.030)
pH: 7 (ref 5.0–8.0)

## 2019-09-18 LAB — HEPATIC FUNCTION PANEL
ALT: 40 U/L (ref 0–44)
AST: 22 U/L (ref 15–41)
Albumin: 4.7 g/dL (ref 3.5–5.0)
Alkaline Phosphatase: 145 U/L — ABNORMAL HIGH (ref 38–126)
Bilirubin, Direct: <0.1 mg/dL (ref 0.0–0.2)
Total Bilirubin: 0.4 mg/dL (ref 0.3–1.2)
Total Protein: 8.2 g/dL — ABNORMAL HIGH (ref 6.5–8.1)

## 2019-09-18 LAB — CBC WITH DIFFERENTIAL/PLATELET
Abs Immature Granulocytes: 0.04 10*3/uL (ref 0.00–0.07)
Basophils Absolute: 0 10*3/uL (ref 0.0–0.1)
Basophils Relative: 0 %
Eosinophils Absolute: 0.1 10*3/uL (ref 0.0–0.5)
Eosinophils Relative: 1 %
HCT: 37 % (ref 36.0–46.0)
Hemoglobin: 12.4 g/dL (ref 12.0–15.0)
Immature Granulocytes: 0 %
Lymphocytes Relative: 10 %
Lymphs Abs: 1 10*3/uL (ref 0.7–4.0)
MCH: 32 pg (ref 26.0–34.0)
MCHC: 33.5 g/dL (ref 30.0–36.0)
MCV: 95.6 fL (ref 80.0–100.0)
Monocytes Absolute: 0.6 10*3/uL (ref 0.1–1.0)
Monocytes Relative: 6 %
Neutro Abs: 8.9 10*3/uL — ABNORMAL HIGH (ref 1.7–7.7)
Neutrophils Relative %: 83 %
Platelets: 340 10*3/uL (ref 150–400)
RBC: 3.87 MIL/uL (ref 3.87–5.11)
RDW: 12.1 % (ref 11.5–15.5)
WBC: 10.6 10*3/uL — ABNORMAL HIGH (ref 4.0–10.5)
nRBC: 0 % (ref 0.0–0.2)

## 2019-09-18 LAB — BASIC METABOLIC PANEL
Anion gap: 9 (ref 5–15)
Anion gap: 9 (ref 5–15)
BUN: 17 mg/dL (ref 6–20)
BUN: 13 mg/dL (ref 6–20)
CO2: 24 mmol/L (ref 22–32)
CO2: 21 mmol/L — ABNORMAL LOW (ref 22–32)
Calcium: 10.4 mg/dL — ABNORMAL HIGH (ref 8.9–10.3)
Calcium: 9.1 mg/dL (ref 8.9–10.3)
Chloride: 100 mmol/L (ref 98–111)
Chloride: 106 mmol/L (ref 98–111)
Creatinine, Ser: 0.86 mg/dL (ref 0.44–1.00)
Creatinine, Ser: 0.94 mg/dL (ref 0.44–1.00)
GFR calc Af Amer: >60 mL/min (ref >60)
GFR calc Af Amer: >60 mL/min (ref >60)
GFR calc non Af Amer: >60 mL/min (ref >60)
GFR calc non Af Amer: >60 mL/min (ref >60)
Glucose, Bld: 104 mg/dL — ABNORMAL HIGH (ref 70–99)
Glucose, Bld: 168 mg/dL — ABNORMAL HIGH (ref 70–99)
Potassium: 4.1 mmol/L (ref 3.5–5.1)
Potassium: 3.6 mmol/L (ref 3.5–5.1)
Sodium: 133 mmol/L — ABNORMAL LOW (ref 135–145)
Sodium: 136 mmol/L (ref 135–145)

## 2019-09-18 LAB — LITHIUM LEVEL
Lithium Lvl: 2.32 mmol/L (ref 0.60–1.20)
Lithium Lvl: 1.73 mmol/L (ref 0.60–1.20)

## 2019-09-18 LAB — URINALYSIS, MICROSCOPIC (REFLEX)

## 2019-09-18 LAB — POCT FASTING CBG KUC MANUAL ENTRY: POCT Glucose (KUC): 120 mg/dL — AB (ref 70–99)

## 2019-09-18 LAB — LIPASE, BLOOD: Lipase: 74 U/L — ABNORMAL HIGH (ref 11–51)

## 2019-09-18 LAB — PREGNANCY, URINE: Preg Test, Ur: NEGATIVE

## 2019-09-18 LAB — MAGNESIUM
Magnesium: 2.2 mg/dL (ref 1.7–2.4)
Magnesium: 2 mg/dL (ref 1.7–2.4)

## 2019-09-18 MED ORDER — POTASSIUM CHLORIDE 10 MEQ/100ML IV SOLN
10.0000 meq | Freq: Once | INTRAVENOUS | Status: AC
  Administered 2019-09-18: 22:00:00 10 meq via INTRAVENOUS
  Filled 2019-09-18: qty 100

## 2019-09-18 MED ORDER — SODIUM CHLORIDE 0.9 % IV BOLUS
1000.0000 mL | Freq: Once | INTRAVENOUS | Status: AC
  Administered 2019-09-18: 16:00:00 1000 mL via INTRAVENOUS

## 2019-09-18 MED ORDER — SODIUM CHLORIDE 0.9 % IV SOLN
Freq: Once | INTRAVENOUS | Status: AC
  Administered 2019-09-18: 22:00:00 via INTRAVENOUS

## 2019-09-18 MED ORDER — MAGNESIUM SULFATE 2 GM/50ML IV SOLN
2.0000 g | Freq: Once | INTRAVENOUS | Status: AC
  Administered 2019-09-19: 2 g via INTRAVENOUS
  Filled 2019-09-18 (×2): qty 50

## 2019-09-18 MED ORDER — SODIUM CHLORIDE 0.9 % IV BOLUS
1000.0000 mL | Freq: Once | INTRAVENOUS | Status: AC
  Administered 2019-09-18: 19:00:00 1000 mL via INTRAVENOUS

## 2019-09-18 NOTE — ED Provider Notes (Addendum)
[facility] EMERGENCY DEPARTMENT Provider Note   CSN: [id number] Arrival date & time: 09/18/19  1321     History   Chief Complaint Chief Complaint  Patient presents with   Dizziness    HPI Christine Cisneros is a 48 y.o. female.     Patient with history of hypertension, bipolar disorder who presents the ED with some mental confusion, jitteriness.  Concern for lithium poisoning.  Denies any chest pain, shortness of breath, abdominal pain.  States that she is had a hard time focusing over the last several weeks.  Denies any weakness or numbness.  Denies any GI symptoms.  Denies any suicidal homicidal ideation.  Denies any hallucinations.  The history is provided by the patient.  Illness Location:  General Severity:  Mild Onset quality:  Gradual Timing:  Intermittent Progression:  Waxing and waning Chronicity:  New Associated symptoms: fatigue   Associated symptoms: no abdominal pain, no chest pain, no cough, no ear pain, no fever, no rash, no shortness of breath, no sore throat and no vomiting     Past Medical History:  Diagnosis Date   Bipolar 1 disorder (HCC)    Hypertension     Patient Active Problem List   Diagnosis Date Noted   Lithium toxicity 09/18/2019   Palpitations 06/01/2019   BMI 30.0-30.9,adult 08/10/2017   Hypertriglyceridemia 08/03/2016   Bipolar disorder (HCC) 03/11/2015   Essential hypertension, benign 03/11/2015    History reviewed. No pertinent surgical history.   OB History    Gravida  1   Para  1   Term  1   Preterm      AB      Living  2     SAB      TAB      Ectopic      Multiple      Live Births  1            Home Medications    Prior to Admission medications   Medication Sig Start Date End Date Taking? Authorizing Provider  calcium carbonate 200 MG capsule Take 600 mg by mouth daily.     [provider]  carbamazepine (TEGRETOL) 200 MG tablet Take 1 tablet (200 mg total) by mouth 4 (four)  times daily. 02/19/19   Cottle, Steva Readyarey G Jr., MD  cholecalciferol (VITAMIN D) 1000 UNITS tablet Take 2,000 Units by mouth daily.     [provider]  lisinopril (ZESTRIL) 20 MG tablet Take 1 tablet (20 mg total) by mouth daily. 09/11/19   Breeback, Jade L, PA-C  lithium carbonate (LITHOBID) 300 MG CR tablet Take 2 tablets (600 mg total) by mouth 2 (two) times daily. Patient taking differently: Take 600 mg by mouth. 2 twice daily, except M, W, F 1 in the morning and 2 at night 05/07/19   Cottle, Steva Readyarey G Jr., MD  losartan (COZAAR) 50 MG tablet Take 1 tablet (50 mg total) by mouth daily. 08/06/19 08/05/20  Breeback, Lesly RubensteinJade L, PA-C  metoprolol tartrate (LOPRESSOR) 100 MG tablet TAKE 1 TABLET BY MOUTH TWICE A DAY 07/30/19   Breeback, Jade L, PA-C  Multiple Vitamin (MULTIVITAMIN) tablet Take 1 tablet by mouth daily.    [provider]    Family History Family History  Problem Relation Age of Onset   Cancer Father        melanoma   Hyperlipidemia Father    Diabetes Maternal Grandfather    Diabetes Paternal Grandmother     Social History  Social History   Tobacco Use   Smoking status: Never Smoker   Smokeless tobacco: Never Used  Substance Use Topics   Alcohol use: No    Alcohol/week: 0.0 standard drinks   Drug use: No     Allergies   Norvasc [amlodipine besylate]   Review of Systems Review of Systems  Constitutional: Positive for fatigue. Negative for chills and fever.  HENT: Negative for ear pain and sore throat.   Eyes: Negative for pain and visual disturbance.  Respiratory: Negative for cough and shortness of breath.   Cardiovascular: Negative for chest pain and palpitations.  Gastrointestinal: Negative for abdominal pain and vomiting.  Genitourinary: Negative for dysuria and hematuria.  Musculoskeletal: Negative for arthralgias and back pain.  Skin: Negative for color change and rash.  Neurological: Positive for dizziness. Negative for seizures, syncope  and weakness.  Psychiatric/Behavioral: Positive for confusion and decreased concentration. The patient is nervous/anxious.   All other systems reviewed and are negative.    Physical Exam Updated Vital Signs  ED Triage Vitals  Enc Vitals Group     BP 09/18/19 1338 (!) 133/103     Pulse Rate 09/18/19 1338 80     Resp 09/18/19 1338 18     Temp 09/18/19 1338 99.1 F (37.3 C)     Temp Source 09/18/19 1338 Oral     SpO2 09/18/19 1338 100 %     Weight 09/18/19 1336 168 lb (76.2 kg)     Height 09/18/19 1336 5' 0.5" (1.537 m)     Head Circumference --      Peak Flow --      Pain Score 09/18/19 1336 0     Pain Loc --      Pain Edu? --      Excl. in GC? --     Physical Exam Vitals signs and nursing note reviewed.  Constitutional:      General: She is not in acute distress.    Appearance: She is well-developed. She is not ill-appearing.  HENT:     Head: Normocephalic and atraumatic.     Nose: Nose normal.     Mouth/Throat:     Mouth: Mucous membranes are moist.  Eyes:     Extraocular Movements: Extraocular movements intact.     Conjunctiva/sclera: Conjunctivae normal.     Pupils: Pupils are equal, round, and reactive to light.  Neck:     Musculoskeletal: Normal range of motion and neck supple.  Cardiovascular:     Rate and Rhythm: Normal rate and regular rhythm.     Heart sounds: No murmur.  Pulmonary:     Effort: Pulmonary effort is normal. No respiratory distress.     Breath sounds: Normal breath sounds.  Abdominal:     Palpations: Abdomen is soft.     Tenderness: There is no abdominal tenderness.  Musculoskeletal: Normal range of motion.        General: No tenderness.  Skin:    General: Skin is warm and dry.     Capillary Refill: Capillary refill takes less than 2 seconds.  Neurological:     General: No focal deficit present.     Mental Status: She is alert and oriented to person, place, and time.     Cranial Nerves: No cranial nerve deficit.     Sensory: No  sensory deficit.     Motor: No weakness.     Coordination: Coordination normal.     Comments: 5+ out of 5 strength, normal sensation, normal  finger-to-nose finger, no drift, normal heel-to-shin, normal speech, mild tremor in hands      ED Treatments / Results  Labs (all labs ordered are listed, but only abnormal results are displayed) Labs Reviewed  CBC WITH DIFFERENTIAL/PLATELET - Abnormal; Notable for the following components:      Result Value   WBC 10.6 (*)    Neutro Abs 8.9 (*)    All other components within normal limits  BASIC METABOLIC PANEL - Abnormal; Notable for the following components:   Sodium 133 (*)    Glucose, Bld 104 (*)    Calcium 10.4 (*)    All other components within normal limits  LITHIUM LEVEL - Abnormal; Notable for the following components:   Lithium Lvl 2.32 (*)    All other components within normal limits  HEPATIC FUNCTION PANEL - Abnormal; Notable for the following components:   Total Protein 8.2 (*)    Alkaline Phosphatase 145 (*)    All other components within normal limits  LIPASE, BLOOD - Abnormal; Notable for the following components:   Lipase 74 (*)    All other components within normal limits  URINALYSIS, ROUTINE W REFLEX MICROSCOPIC - Abnormal; Notable for the following components:   Leukocytes,Ua TRACE (*)    All other components within normal limits  URINALYSIS, MICROSCOPIC (REFLEX) - Abnormal; Notable for the following components:   Bacteria, UA FEW (*)    All other components within normal limits  SARS CORONAVIRUS 2 (TAT 6-24 HRS)  PREGNANCY, URINE  MAGNESIUM  BASIC METABOLIC PANEL  BASIC METABOLIC PANEL  BASIC METABOLIC PANEL  LITHIUM LEVEL  LITHIUM LEVEL  LITHIUM LEVEL  MAGNESIUM  MAGNESIUM  MAGNESIUM    EKG EKG Interpretation  Date/Time:  Tuesday September 18 2019 15:39:44 EST Ventricular Rate:  89 PR Interval:    QRS Duration: 111 QT Interval:  404 QTC Calculation: 492 R Axis:   28 Text Interpretation: Sinus  rhythm Borderline T abnormalities, diffuse leads Borderline prolonged QT interval Confirmed by Virgina Norfolk [phone]) on 09/18/2019 3:42:27 PM   Radiology Dg Chest 2 View  Result Date: 09/18/2019 CLINICAL DATA:  Dizziness, shaking. EXAM: CHEST - 2 VIEW COMPARISON:  None. FINDINGS: The heart size and mediastinal contours are within normal limits. Both lungs are clear. The visualized skeletal structures are unremarkable. IMPRESSION: No active cardiopulmonary disease.  No evidence of pneumonia. Electronically Signed   By: Bary Richard M.D.   On: 09/18/2019 16:47   Ct Head Wo Contrast  Result Date: 09/18/2019 CLINICAL DATA:  Altered level of consciousness EXAM: CT HEAD WITHOUT CONTRAST TECHNIQUE: Contiguous axial images were obtained from the base of the skull through the vertex without intravenous contrast. COMPARISON:  None FINDINGS: Brain: No evidence of acute infarction, hemorrhage, hydrocephalus, extra-axial collection or mass lesion/mass effect. Beam hardening artifact along the left frontal convexity mimics a small extra-axial collection along left frontal lobe without correlate on multiplanar reconstruction. Mild prominence of the ventricles, cisterns and sulci compatible with parenchymal volume loss. Vascular: No hyperdense vessel or unexpected calcification. Skull: No calvarial fracture or suspicious osseous lesion. No scalp swelling or hematoma. Sinuses/Orbits: Paranasal sinuses and mastoid air cells are predominantly clear. Included orbital structures are unremarkable. Other: None IMPRESSION: No acute intracranial abnormality. Slight prominence of the ventricles and sulci may suggest mild age advanced parenchymal volume loss. Electronically Signed   By: Kreg Shropshire M.D.   On: 09/18/2019 16:26    Procedures .Critical Care Performed by: Virgina Norfolk, DO Authorized by: Virgina Norfolk, DO  Critical care provider statement:    Critical care time (minutes):  45   Critical care was necessary  to treat or prevent imminent or life-threatening deterioration of the following conditions:  Toxidrome   Critical care was time spent personally by me on the following activities:  Blood draw for specimens, development of treatment plan with patient or surrogate, discussions with primary provider, discussions with consultants, evaluation of patient's response to treatment, examination of patient, obtaining history from patient or surrogate, ordering and performing treatments and interventions, ordering and review of laboratory studies, ordering and review of radiographic studies, pulse oximetry, re-evaluation of patient's condition and review of old charts   I assumed direction of critical care for this patient from another provider in my specialty: no     (including critical care time)  Medications Ordered in ED Medications  0.9 %  sodium chloride infusion (has no administration in time range)  sodium chloride 0.9 % bolus 1,000 mL (0 mLs Intravenous Stopped 09/18/19 1729)  sodium chloride 0.9 % bolus 1,000 mL (1,000 mLs Intravenous New Bag/Given 09/18/19 1900)     Initial Impression / Assessment and Plan / ED Course  I have reviewed the triage vital signs and the nursing notes.  Pertinent labs & imaging results that were available during my care of the patient were reviewed by me and considered in my medical decision making (see chart for details).     Royanne Bruni is a 48 year old female with history of bipolar disorder who presents the ED with fatigue.  Patient with unremarkable vitals.  No fever.  Patient is on lithium and she is concerned about lithium toxicity given her symptoms over the last several weeks of fatigue, tremors, difficulty focusing.  She denies any suicidal homicidal ideation.  She denies any GI symptoms.  No chest pain, no shortness of breath.  Has normal neurological exam.  Overall patient appears well.  No concern for stroke on exam.  Will check a lithium level.  We will get  basic lab work including chest x-ray, urinalysis.  Will give IV fluids.  Will reevaluate.  No concern for cardiac or pulmonary process.  May be symptoms are secondary to lithium.  EKG shows sinus rhythm.  Could be some underlying anxiety or electrolyte abnormality.  Lithium level elevated to 2.32.  Otherwise no electrolyte abnormalities.  Potassium 4.1.  Creatinine normal.  Patient has some mild confusion on exam.  Has not had any seizures or GI symptoms.  Feels better after 1 L saline bolus.  Talked with poison control who recommends aggressive hydration at this time.  Does not meet criteria for dialysis as this is a chronic overdose.  They recommend lithium levels, metabolic panels, magnesium levels every 4 hours.  They recommend hydration with a goal of urinary output of 1 cc/kg/h.  This is about 80 cc an hour.  Patient given additional saline bolus and started on 150 cc of normal saline infusion.  To be admitted to medicine service for further care.  Hemodynamically stable throughout my care.  Patient still remains in the ED.  Repeat BMP shows slightly downtrending potassium.  Magnesium slightly downtrending.  Will replete with IV potassium and IV magnesium.  Repeat lithium level is pending.  Patient has had adequate urinary output.  Lithium is down trended.  This chart was dictated using voice recognition software.  Despite [employer] efforts to proofread,  errors can occur which can change the documentation meaning.    Final Clinical Impressions(s) / ED Diagnoses  Final diagnoses:  Lithium toxicity, accidental or unintentional, initial encounter    ED Discharge Orders    None       Virgina Norfolk, DO 09/18/19 1925    Virgina Norfolk, DO 09/18/19 2206    Virgina Norfolk, DO 09/18/19 2234

## 2019-09-18 NOTE — ED Triage Notes (Signed)
Pt as been shaking for last few days, has become worse today.  Thought that maybe her lithium level was off.  Has had trouble with concentration.  Dizziness, and having trouble with vision.

## 2019-09-18 NOTE — ED Triage Notes (Signed)
Dizziness. She was seen at UC this am. States she has been shaking for the last few days. She thinks her lithium level is abnormal. She is having trouble concentrating.

## 2019-09-18 NOTE — Discharge Instructions (Signed)
Please go to the emergency room for stat blood levels and IV hydration.

## 2019-09-18 NOTE — ED Notes (Signed)
Date and time results received: 09/18/19 2240 (use smartphrase ".now" to insert current time)  Test: lithium level Critical Value: 1.73  Name of Provider Notified: Dr Ronnald Nian  Orders Received? Or Actions Taken?: none

## 2019-09-18 NOTE — ED Notes (Signed)
ED Provider at bedside. 

## 2019-09-18 NOTE — Telephone Encounter (Signed)
Christine Cisneros started to feel dizzy and shaky today. She wanted to come in this morning for a visit. I advised there are no appointments this morning. She is going to go to the Urgent Care.

## 2019-09-18 NOTE — ED Notes (Signed)
Per [facility]-Lithium critical level 2.32 Reported to Dr Dondra Spry

## 2019-09-18 NOTE — ED Notes (Signed)
Pt reports going to UC and states she was snet here with risks of lithium toxicity. Pt reports jittery, dizziness, lethargy.Pt denies pain, denies sob

## 2019-09-18 NOTE — ED Notes (Signed)
Boyfriend at bedside.  Patient informed that we have to monitor her I & O.  Both verbalized understanding.

## 2019-09-18 NOTE — ED Notes (Addendum)
Date and time results received: 09/18/19 1624  Test: Lithium Critical Value: 2.32  Name of Provider Notified: Leodis Sias  Orders Received? Or Actions Taken?:

## 2019-09-18 NOTE — ED Notes (Signed)
2 unsuccessful IV attempts.

## 2019-09-18 NOTE — ED Notes (Signed)
Patient transported to CT 

## 2019-09-18 NOTE — Telephone Encounter (Signed)
I do have a 1:40 if she can wait until then.

## 2019-09-18 NOTE — ED Provider Notes (Addendum)
[facility]    CSN: [id number] Arrival date & time: 09/18/19  1144      History   Chief Complaint Chief Complaint  Patient presents with  . Dizziness    HPI Christine Cisneros is a 48 y.o. female.   HPI Patient presents with dizziness and unsteadiness.  Pertinent history reveals patient is currently on lithium for bipolar disease.  She has regular checkups with her psychiatrist.  Her last lithium level was done in July and was 0.6.  She has had no changes in medications.  She has felt sluggish for the last 2 weeks.  She has had trouble with processing conversations.  She occasionally pitches to one side or the other.  Her vision also seems off today. Past Medical History:  Diagnosis Date  . Bipolar 1 disorder (HCC)   . Hypertension     Patient Active Problem List   Diagnosis Date Noted  . Palpitations 06/01/2019  . BMI 30.0-30.9,adult 08/10/2017  . Hypertriglyceridemia 08/03/2016  . Bipolar disorder (HCC) 03/11/2015  . Essential hypertension, benign 03/11/2015    History reviewed. No pertinent surgical history.  OB History    Gravida  1   Para  1   Term  1   Preterm      AB      Living  2     SAB      TAB      Ectopic      Multiple      Live Births  1            Home Medications    Prior to Admission medications   Medication Sig Start Date End Date Taking? Authorizing Provider  calcium carbonate 200 MG capsule Take 600 mg by mouth daily.     [provider]  carbamazepine (TEGRETOL) 200 MG tablet Take 1 tablet (200 mg total) by mouth 4 (four) times daily. 02/19/19   Cottle, Steva Readyarey G Jr., MD  cholecalciferol (VITAMIN D) 1000 UNITS tablet Take 2,000 Units by mouth daily.     [provider]  lisinopril (ZESTRIL) 20 MG tablet Take 1 tablet (20 mg total) by mouth daily. 09/11/19   Breeback, Jade L, PA-C  lithium carbonate (LITHOBID) 300 MG CR tablet Take 2 tablets (600 mg total) by mouth 2 (two) times daily. Patient taking  differently: Take 600 mg by mouth. 2 twice daily, except M, W, F 1 in the morning and 2 at night 05/07/19   Cottle, Steva Readyarey G Jr., MD  losartan (COZAAR) 50 MG tablet Take 1 tablet (50 mg total) by mouth daily. 08/06/19 08/05/20  Breeback, Lesly RubensteinJade L, PA-C  metoprolol tartrate (LOPRESSOR) 100 MG tablet TAKE 1 TABLET BY MOUTH TWICE A DAY 07/30/19   Breeback, Jade L, PA-C  Multiple Vitamin (MULTIVITAMIN) tablet Take 1 tablet by mouth daily.    [provider]    Family History Family History  Problem Relation Age of Onset  . Cancer Father        melanoma  . Hyperlipidemia Father   . Diabetes Maternal Grandfather   . Diabetes Paternal Grandmother     Social History Social History   Tobacco Use  . Smoking status: Never Smoker  . Smokeless tobacco: Never Used  Substance Use Topics  . Alcohol use: No    Alcohol/week: 0.0 standard drinks  . Drug use: No     Allergies   Norvasc [amlodipine besylate]   Review of Systems Review of Systems  Constitutional: Negative.  HENT: Negative.   Neurological: Positive for dizziness and tremors.  Hematological: Negative.   Psychiatric/Behavioral:       History of bipolar disease currently under treatment.     Physical Exam Triage Vital Signs ED Triage Vitals  Enc Vitals Group     BP 09/18/19 1203 (!) 143/84     Pulse Rate 09/18/19 1203 70     Resp 09/18/19 1203 20     Temp 09/18/19 1203 98.2 F (36.8 C)     Temp Source 09/18/19 1203 Oral     SpO2 09/18/19 1203 97 %     Weight 09/18/19 1204 168 lb (76.2 kg)     Height 09/18/19 1204 5' 0.5" (1.537 m)     Head Circumference --      Peak Flow --      Pain Score 09/18/19 1204 0     Pain Loc --      Pain Edu? --      Excl. in GC? --    No data found.  Updated Vital Signs BP (!) 143/84 (BP Location: Right Arm)   Pulse 70   Temp 98.2 F (36.8 C) (Oral)   Resp 20   Ht 5' 0.5" (1.537 m)   Wt 76.2 kg   SpO2 97%   BMI 32.27 kg/m   Visual Acuity Right Eye Distance: 20/40  Left Eye Distance: 20/40 Bilateral Distance: 20/50  Right Eye Near:   Left Eye Near:    Bilateral Near:     Physical Exam Constitutional:      Comments: Patient is alert cooperative and oriented.  HENT:     Head: Normocephalic.  Cardiovascular:     Rate and Rhythm: Normal rate.  Neurological:     General: No focal deficit present.     Mental Status: She is oriented to person, place, and time.     Comments: Patient has a wide-based gait.  There is a fine tremor at rest.  She also has some tremor of the lower extremities.  Deep tendon reflexes were 3+ upper and lower extremities.  No focal weakness was elicited.      UC Treatments / Results  Labs (all labs ordered are listed, but only abnormal results are displayed) Labs Reviewed  POCT FASTING CBG [facility] MANUAL ENTRY - Abnormal; Notable for the following components:      Result Value   POCT Glucose ([facility]) 120 (*)    All other components within normal limits    EKG   Radiology No results found.  Procedures Procedures (including critical care time)  Medications Ordered in UC Medications - No data to display  Initial Impression / Assessment and Plan / UC Course  I have reviewed the triage vital signs and the nursing notes. I am very concerned this is lithium toxicity.  Patient advised the need to go to the emergency room and had a stat lithium level done and begin IV fluids.  We will check fingerstick sugar prior to leaving.  I felt it was imperative the patient have stat blood work done.  We were unable to get the lithium test results.  We called her psychiatrist office and they were out to lunch.  If her lithium is normal she will need further evaluation by blood work and possible imaging.  I did call the emergency room at [facility] and the message on the phone says no calls will be taken by physician or a nurse. Pertinent labs & imaging results that were  available during my care of the patient were reviewed by me  and considered in my medical decision making (see chart for details).     Final Clinical Impressions(s) / UC Diagnoses   Final diagnoses:  Dizziness  Tremor     Discharge Instructions     Please go to the emergency room for stat blood levels and IV hydration.    ED Prescriptions    None     PDMP not reviewed this encounter.   Darlyne Russian, MD 09/18/19 1436    Darlyne Russian, MD 09/18/19 [phone]

## 2019-09-18 NOTE — ED Notes (Signed)
After consultation with lab, we are unable to perform Cooxemetry Panel at this facility. We do not have the capability and due to the time constraints on the test can not send it out. Per [facility] test to be performed when patient arrives at [facility].

## 2019-09-18 NOTE — Telephone Encounter (Signed)
She would not wait until the 1:40 appointment.

## 2019-09-19 ENCOUNTER — Other Ambulatory Visit: Payer: Self-pay

## 2019-09-19 ENCOUNTER — Telehealth: Payer: Self-pay | Admitting: Psychiatry

## 2019-09-19 DIAGNOSIS — F319 Bipolar disorder, unspecified: Secondary | ICD-10-CM

## 2019-09-19 DIAGNOSIS — T56894A Toxic effect of other metals, undetermined, initial encounter: Secondary | ICD-10-CM

## 2019-09-19 DIAGNOSIS — Z114 Encounter for screening for human immunodeficiency virus [HIV]: Secondary | ICD-10-CM

## 2019-09-19 LAB — BASIC METABOLIC PANEL
Anion gap: 6 (ref 5–15)
BUN: 11 mg/dL (ref 6–20)
CO2: 21 mmol/L — ABNORMAL LOW (ref 22–32)
Calcium: 9 mg/dL (ref 8.9–10.3)
Chloride: 113 mmol/L — ABNORMAL HIGH (ref 98–111)
Creatinine, Ser: 0.8 mg/dL (ref 0.44–1.00)
GFR calc Af Amer: >60 mL/min (ref >60)
GFR calc non Af Amer: >60 mL/min (ref >60)
Glucose, Bld: 143 mg/dL — ABNORMAL HIGH (ref 70–99)
Potassium: 3.9 mmol/L (ref 3.5–5.1)
Sodium: 140 mmol/L (ref 135–145)

## 2019-09-19 LAB — MAGNESIUM: Magnesium: 2.4 mg/dL (ref 1.7–2.4)

## 2019-09-19 LAB — SARS CORONAVIRUS 2 (TAT 6-24 HRS): SARS Coronavirus 2: NEGATIVE

## 2019-09-19 LAB — CARBAMAZEPINE LEVEL, TOTAL: Carbamazepine Lvl: 4 ug/mL (ref 4.0–12.0)

## 2019-09-19 LAB — LITHIUM LEVEL: Lithium Lvl: 1.17 mmol/L (ref 0.60–1.20)

## 2019-09-19 LAB — TSH: TSH: 2.79 u[IU]/mL (ref 0.350–4.500)

## 2019-09-19 LAB — HIV ANTIBODY (ROUTINE TESTING W REFLEX): HIV Screen 4th Generation wRfx: NONREACTIVE

## 2019-09-19 MED ORDER — LITHIUM CARBONATE ER 300 MG PO TBCR: 300.0000 mg | EXTENDED_RELEASE_TABLET | ORAL | Status: DC

## 2019-09-19 MED ORDER — ACETAMINOPHEN 650 MG RE SUPP: 650.0000 mg | Freq: Four times a day (QID) | RECTAL | Status: DC | PRN

## 2019-09-19 MED ORDER — ACETAMINOPHEN 325 MG PO TABS
650.0000 mg | ORAL_TABLET | Freq: Four times a day (QID) | ORAL | Status: DC | PRN
  Administered 2019-09-19: 03:00:00 650 mg via ORAL
  Filled 2019-09-19: qty 2

## 2019-09-19 MED ORDER — SODIUM CHLORIDE 0.9 % IV SOLN
INTRAVENOUS | Status: DC
  Administered 2019-09-19 (×2): via INTRAVENOUS

## 2019-09-19 MED ORDER — SODIUM CHLORIDE 0.9 % IV SOLN
INTRAVENOUS | Status: DC
  Administered 2019-09-19: 02:00:00 via INTRAVENOUS

## 2019-09-19 MED ORDER — LISINOPRIL 20 MG PO TABS: 20.0000 mg | ORAL_TABLET | Freq: Every day | ORAL | Status: DC

## 2019-09-19 MED ORDER — METOPROLOL TARTRATE 100 MG PO TABS
100.0000 mg | ORAL_TABLET | Freq: Two times a day (BID) | ORAL | Status: DC
  Administered 2019-09-19: 04:00:00 100 mg via ORAL
  Filled 2019-09-19 (×2): qty 1

## 2019-09-19 MED ORDER — METOPROLOL TARTRATE 100 MG PO TABS: 100.0000 mg | ORAL_TABLET | Freq: Two times a day (BID) | ORAL | Status: DC

## 2019-09-19 MED ORDER — ENOXAPARIN SODIUM 40 MG/0.4ML ~~LOC~~ SOLN
40.0000 mg | SUBCUTANEOUS | Status: DC
  Administered 2019-09-19: 40 mg via SUBCUTANEOUS
  Filled 2019-09-19: qty 0.4

## 2019-09-19 NOTE — H&P (Addendum)
History and Physical    Christine Cisneros WJX:[id number] DOB: 12/08/1970 DOA: 09/18/2019  PCP: Jomarie LongsBreeback, Jade L, PA-C [facility]  Chief Complaint: Jitteriness  HPI: Christine Cisneros is a 48 y.o. female with medical history significant of hypertension, bipolar disorder presenting with a chief complaint of jitteriness.  Patient states for the past few days she has been feeling more tired than usual and has been very jittery.  She is having tremors in both of her hands and feet.  She is having difficulty focusing.  States "my gait is ataxic."  Reports taking lithium and carbamazepine for her bipolar disorder.  Her psychiatrist is Dr. Jennelle Humanottle.  States her last office visit was in July and at that time her lithium level was normal so no dose changes were made.  She takes lithium 600 mg in the morning and 600 mg in the evening every Tuesday, Thursday, Saturday, and Sunday.  On Monday, Wednesday, and Friday she takes lithium 300 mg in the morning and 600 mg in the evening.  Denies taking extra pills.  Denies suicidal ideation.  ED Course: Hemodynamically stable.  Lithium level 2.32.  Potassium 4.1, magnesium 2.2.  EKG with sinus rhythm and QTC 492.  Poison control was consulted.  Repeat labs with potassium 3.6 and magnesium 2.0.  Lithium level down trended on repeat labs.  Chest x-ray showing no active cardiopulmonary disease.  Head CT negative for acute finding.  Patient received 2 L normal saline boluses, potassium supplementation, and magnesium supplementation.  Review of Systems:  All systems reviewed and apart from history of presenting illness, are negative.  Past Medical History:  Diagnosis Date   Bipolar 1 disorder (HCC)    Hypertension     History reviewed. No pertinent surgical history.   reports that she has never smoked. She has never used smokeless tobacco. She reports that she does not drink alcohol or use drugs.  Allergies  Allergen Reactions   Norvasc [Amlodipine Besylate]    Nausea/SOB/chest tightness    Family History  Problem Relation Age of Onset   Cancer Father        melanoma   Hyperlipidemia Father    Diabetes Maternal Grandfather    Diabetes Paternal Grandmother     Prior to Admission medications   Medication Sig Start Date End Date Taking? Authorizing Provider  calcium carbonate 200 MG capsule Take 600 mg by mouth daily.     [provider]  carbamazepine (TEGRETOL) 200 MG tablet Take 1 tablet (200 mg total) by mouth 4 (four) times daily. 02/19/19   Cottle, Steva Readyarey G Jr., MD  cholecalciferol (VITAMIN D) 1000 UNITS tablet Take 2,000 Units by mouth daily.     [provider]  lisinopril (ZESTRIL) 20 MG tablet Take 1 tablet (20 mg total) by mouth daily. 09/11/19   Breeback, Jade L, PA-C  lithium carbonate (LITHOBID) 300 MG CR tablet Take 2 tablets (600 mg total) by mouth 2 (two) times daily. Patient taking differently: Take 600 mg by mouth. 2 twice daily, except M, W, F 1 in the morning and 2 at night 05/07/19   Cottle, Steva Readyarey G Jr., MD  losartan (COZAAR) 50 MG tablet Take 1 tablet (50 mg total) by mouth daily. 08/06/19 08/05/20  Breeback, Lesly RubensteinJade L, PA-C  metoprolol tartrate (LOPRESSOR) 100 MG tablet TAKE 1 TABLET BY MOUTH TWICE A DAY 07/30/19   Breeback, Jade L, PA-C  Multiple Vitamin (MULTIVITAMIN) tablet Take 1 tablet by mouth daily.    [provider]  Physical Exam: Vitals:   09/18/19 2230 09/18/19 2300 09/18/19 2330 09/19/19 0044  BP: 116/66 128/69 122/74 (!) 159/86  Pulse: 94 94 92 93  Resp: (!) 21 (!) 21 17 16   Temp:    98.9 F (37.2 C)  TempSrc:    Oral  SpO2: 96% 93% 98% 97%  Weight:      Height:        Physical Exam  Constitutional: She is oriented to person, place, and time. She appears well-developed and well-nourished. No distress.  HENT:  Head: Normocephalic.  Eyes: Right eye exhibits no discharge. Left eye exhibits no discharge.  Neck: Neck supple.  Cardiovascular: Normal rate, regular rhythm and  intact distal pulses.  Pulmonary/Chest: Effort normal and breath sounds normal. No respiratory distress. She has no wheezes. She has no rales.  Abdominal: Soft. Bowel sounds are normal. She exhibits no distension. There is no abdominal tenderness. There is no guarding.  Musculoskeletal:        General: No edema.  Neurological: She is alert and oriented to person, place, and time.  Tremors of hands and feet  Skin: Skin is warm and dry. She is not diaphoretic.  Psychiatric:  Looking down the entire time, no eye contact     Labs on Admission: I have personally reviewed following labs and imaging studies  CBC: Recent Labs  Lab 09/18/19 1551  WBC 10.6*  NEUTROABS 8.9*  HGB 12.4  HCT 37.0  MCV 95.6  PLT 340   Basic Metabolic Panel: Recent Labs  Lab 09/18/19 1551 09/18/19 2046  NA 133* 136  K 4.1 3.6  CL 100 106  CO2 24 21*  GLUCOSE 104* 168*  BUN 17 13  CREATININE 0.86 0.94  CALCIUM 10.4* 9.1  MG 2.2 2.0   GFR: Estimated Creatinine Clearance: 67.6 mL/min (by C-G formula based on SCr of 0.94 mg/dL). Liver Function Tests: Recent Labs  Lab 09/18/19 1551  AST 22  ALT 40  ALKPHOS 145*  BILITOT 0.4  PROT 8.2*  ALBUMIN 4.7   Recent Labs  Lab 09/18/19 1551  LIPASE 74*   No results for input(s): AMMONIA in the last 168 hours. Coagulation Profile: No results for input(s): INR, PROTIME in the last 168 hours. Cardiac Enzymes: No results for input(s): CKTOTAL, CKMB, CKMBINDEX, TROPONINI in the last 168 hours. BNP (last 3 results) No results for input(s): PROBNP in the last 8760 hours. HbA1C: No results for input(s): HGBA1C in the last 72 hours. CBG: No results for input(s): GLUCAP in the last 168 hours. Lipid Profile: No results for input(s): CHOL, HDL, LDLCALC, TRIG, CHOLHDL, LDLDIRECT in the last 72 hours. Thyroid Function Tests: No results for input(s): TSH, T4TOTAL, FREET4, T3FREE, THYROIDAB in the last 72 hours. Anemia Panel: No results for input(s):  VITAMINB12, FOLATE, FERRITIN, TIBC, IRON, RETICCTPCT in the last 72 hours. Urine analysis:    Component Value Date/Time   COLORURINE YELLOW 09/18/2019 1551   APPEARANCEUR CLEAR 09/18/2019 1551   LABSPEC 1.010 09/18/2019 1551   PHURINE 7.0 09/18/2019 1551   GLUCOSEU NEGATIVE 09/18/2019 1551   HGBUR NEGATIVE 09/18/2019 1551   BILIRUBINUR NEGATIVE 09/18/2019 1551   KETONESUR NEGATIVE 09/18/2019 1551   PROTEINUR NEGATIVE 09/18/2019 1551   NITRITE NEGATIVE 09/18/2019 1551   LEUKOCYTESUR TRACE (A) 09/18/2019 1551    Radiological Exams on Admission: Dg Chest 2 View  Result Date: 09/18/2019 CLINICAL DATA:  Dizziness, shaking. EXAM: CHEST - 2 VIEW COMPARISON:  None. FINDINGS: The heart size and mediastinal contours are within normal limits.  Both lungs are clear. The visualized skeletal structures are unremarkable. IMPRESSION: No active cardiopulmonary disease.  No evidence of pneumonia. Electronically Signed   By: Bary Richard M.D.   On: 09/18/2019 16:47   Ct Head Wo Contrast  Result Date: 09/18/2019 CLINICAL DATA:  Altered level of consciousness EXAM: CT HEAD WITHOUT CONTRAST TECHNIQUE: Contiguous axial images were obtained from the base of the skull through the vertex without intravenous contrast. COMPARISON:  None FINDINGS: Brain: No evidence of acute infarction, hemorrhage, hydrocephalus, extra-axial collection or mass lesion/mass effect. Beam hardening artifact along the left frontal convexity mimics a small extra-axial collection along left frontal lobe without correlate on multiplanar reconstruction. Mild prominence of the ventricles, cisterns and sulci compatible with parenchymal volume loss. Vascular: No hyperdense vessel or unexpected calcification. Skull: No calvarial fracture or suspicious osseous lesion. No scalp swelling or hematoma. Sinuses/Orbits: Paranasal sinuses and mastoid air cells are predominantly clear. Included orbital structures are unremarkable. Other: None IMPRESSION: No  acute intracranial abnormality. Slight prominence of the ventricles and sulci may suggest mild age advanced parenchymal volume loss. Electronically Signed   By: Kreg Shropshire M.D.   On: 09/18/2019 16:26    EKG: Independently reviewed.  Sinus rhythm, QTc 492.  Assessment/Plan Principal Problem:   Lithium toxicity Active Problems:   Bipolar disorder (HCC)   Essential hypertension, benign   Encounter for screening for HIV   Lithium toxicity Patient is presenting with complaints of fatigue, tremors, difficulty focusing, and ataxic gait.  No GI symptoms such as vomiting or diarrhea.  No seizures.  Denies any changes to her lithium dosing.  Denies taking extra pills.  Denies suicidal ideation.  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  Per patient, her lithium level was last checked in July.  Lithium level elevated at 2.32.  Sodium level 133, potassium 4.1, magnesium 2.2, BUN 17, creatinine 0.8, bicarb 24, and anion gap 9 on initial labs.  Patient received 2 L normal saline boluses.  Repeat labs showing sodium 136, potassium 3.6, magnesium 2.2, BUN 13, creatinine 0.9, bicarb 21, and anion gap 9.  Lithium level down trended on repeat labs.  Patient received potassium and magnesium supplementation.  ED provider discussed the case with poison control and recommendations listed below. -Cardiac monitoring -EKGs every 4 hours -Monitor BMP, magnesium, and lithium levels every 4 hours -Aggressive IV fluid hydration with goal urine output of 1 cc/kg/hr.  Bipolar disorder Presenting with signs and symptoms of lithium toxicity. -Hold lithium at this time -Please discuss with patient's psychiatrist Dr. Jennelle Human in the morning.  Hypertension -Resume home metoprolol, patient confirmed that she is taking this medication at home.  HIV screening The patient falls between the ages of 13-64 and should be screened for HIV, therefore HIV testing ordered.   Pharmacy med rec pending.  DVT prophylaxis: Lovenox Code  Status: Full code Family Communication: No family available. Disposition Plan: Anticipate discharge in 1 to 2 days. Consults called: None Admission status: It is my clinical opinion that referral for OBSERVATION is reasonable and necessary in this patient based on the above information provided. The aforementioned taken together are felt to place the patient at high risk for further clinical deterioration. However it is anticipated that the patient may be medically stable for discharge from the hospital within 24 to 48 hours.  The medical decision making on this patient was of high complexity and the patient is at high risk for clinical deterioration, therefore this is a level 3 visit.  John Giovanni MD [facility]  Pager [phone]  If 7PM-7AM, please contact night-coverage [email or web address] Password [email or web address]  09/19/2019, 3:02 AM

## 2019-09-19 NOTE — Progress Notes (Signed)
Christine Cisneros to be D/C'd Home per MD order.  Discussed with the patient and all questions fully answered.  VSS, Skin clean, dry and intact without evidence of skin break down, no evidence of skin tears noted. IV catheter discontinued intact. Site without signs and symptoms of complications. Dressing and pressure applied.  An After Visit Summary was printed and given to the patient. Patient received prescription.  D/c education completed with patient/family including follow up instructions, medication list, d/c activities limitations if indicated, with other d/c instructions as indicated by MD - patient able to verbalize understanding, all questions fully answered.   Patient instructed to return to ED, call 911, or call MD for any changes in condition.   Patient escorted via [facility], and D/C home via private auto.  [name] 09/19/2019 6:01 PM

## 2019-09-19 NOTE — Progress Notes (Signed)
Spoke with Andrez Grime  from  poison control who stated that since pt's lithium level is trending down, and vital sighs are stable pt is  Stable for discharge from poison control standpoint

## 2019-09-19 NOTE — Progress Notes (Signed)
Patient admitted after midnight, please see H&P.  Patient recently started zinc with pumpkin seeds: Pumpkin seeds have a very mild diuretic effect and can reduce excretion and increase serum levels of lithium. Plan to trend BMP, Mg and lithium levels.  Per nurse poison control wanted tegretol level. EKG q 4 hours   Eulogio Bear DO

## 2019-09-19 NOTE — Progress Notes (Signed)
Nurse was told by lab personnel that patient's admission labs from [facility] ER had not "crossed over" to lab system, and that is the reason that admission labs were not drawn.  Dr. Marlowe Sax paged regarding need to reorder admission labs.

## 2019-09-19 NOTE — Telephone Encounter (Signed)
RTC She's leaving the hospital and is unavailable.

## 2019-09-19 NOTE — Discharge Summary (Signed)
Physician Discharge Summary  Christine Cisneros YNW:[id number] DOB: [birth year] DOA: 09/18/2019  PCP: Donella Stade, PA-C  Admit date: 09/18/2019 Discharge date: 09/19/2019  Admitted From: home Discharge disposition: home   Recommendations for Outpatient Follow-Up:   1. Suspect patient's supplement caused her increased lithium levels   Discharge Diagnosis:   Principal Problem:   Lithium toxicity Active Problems:   Bipolar disorder ([place])   Essential hypertension, benign   Encounter for screening for HIV    Discharge Condition: Improved.  Diet recommendation: regular.  Wound care: None.  Code status: Full.   History of Present Illness:    Christine Cisneros is a 48 y.o. female with medical history significant of hypertension, bipolar disorder presenting with a chief complaint of jitteriness.  Patient states for the past few days she has been feeling more tired than usual and has been very jittery.  She is having tremors in both of her hands and feet.  She is having difficulty focusing.  States "my gait is ataxic."  Reports taking lithium and carbamazepine for her bipolar disorder.  Her psychiatrist is Dr. Clovis Pu.  States her last office visit was in July and at that time her lithium level was normal so no dose changes were made.  She takes lithium 600 mg in the morning and 600 mg in the evening every Tuesday, Thursday, Saturday, and Sunday.  On Monday, Wednesday, and Friday she takes lithium 300 mg in the morning and 600 mg in the evening.  Denies taking extra pills.  Denies suicidal ideation.   Hospital Course by Problem:   Lithium toxicity Patient is presenting with complaints of fatigue, tremors, difficulty focusing, and ataxic gait.  No GI symptoms such as vomiting or diarrhea.  No seizures.  Denies any changes to her lithium dosing.  Patient recently started zinc with pumpkin seeds: Pumpkin seeds have a very mild diuretic effect and can reduce excretion and increase serum  levels of lithium. Patient improved faster than expected and lithium levels came down to normal range -labs normal, EKG w/o Qtc prolongation  Bipolar disorder Presenting with signs and symptoms of lithium toxicity. -resume lithium -close outpatient follow up with psych  Hypertension -Resume home metoprolol    Medical Consultants:      Discharge Exam:   Vitals:   09/19/19 0859 09/19/19 1536  BP: 104/61 131/72  Pulse: (!) 58 60  Resp:  20  Temp:  98.2 F (36.8 C)  SpO2:  99%   Vitals:   09/19/19 0044 09/19/19 0503 09/19/19 0859 09/19/19 1536  BP: (!) 159/86 112/67 104/61 131/72  Pulse: 93 78 (!) 58 60  Resp: 16 16  20   Temp: 98.9 F (37.2 C) 98.9 F (37.2 C)  98.2 F (36.8 C)  TempSrc: Oral Oral  Oral  SpO2: 97% 100%  99%  Weight:      Height:        General exam: Appears calm and comfortable.   The results of significant diagnostics from this hospitalization (including imaging, microbiology, ancillary and laboratory) are listed below for reference.     Procedures and Diagnostic Studies:   Dg Chest 2 View  Result Date: 09/18/2019 CLINICAL DATA:  Dizziness, shaking. EXAM: CHEST - 2 VIEW COMPARISON:  None. FINDINGS: The heart size and mediastinal contours are within normal limits. Both lungs are clear. The visualized skeletal structures are unremarkable. IMPRESSION: No active cardiopulmonary disease.  No evidence of pneumonia. Electronically Signed   By: Roxy Horseman.D.  On: 09/18/2019 16:47   Ct Head Wo Contrast  Result Date: 09/18/2019 CLINICAL DATA:  Altered level of consciousness EXAM: CT HEAD WITHOUT CONTRAST TECHNIQUE: Contiguous axial images were obtained from the base of the skull through the vertex without intravenous contrast. COMPARISON:  None FINDINGS: Brain: No evidence of acute infarction, hemorrhage, hydrocephalus, extra-axial collection or mass lesion/mass effect. Beam hardening artifact along the left frontal convexity mimics a small  extra-axial collection along left frontal lobe without correlate on multiplanar reconstruction. Mild prominence of the ventricles, cisterns and sulci compatible with parenchymal volume loss. Vascular: No hyperdense vessel or unexpected calcification. Skull: No calvarial fracture or suspicious osseous lesion. No scalp swelling or hematoma. Sinuses/Orbits: Paranasal sinuses and mastoid air cells are predominantly clear. Included orbital structures are unremarkable. Other: None IMPRESSION: No acute intracranial abnormality. Slight prominence of the ventricles and sulci may suggest mild age advanced parenchymal volume loss. Electronically Signed   By: Kreg ShropshirePrice  DeHay M.D.   On: 09/18/2019 16:26     Labs:   Basic Metabolic Panel: Recent Labs  Lab 09/18/19 1551 09/18/19 2046 09/19/19 1121  NA 133* 136 140  K 4.1 3.6 3.9  CL 100 106 113*  CO2 24 21* 21*  GLUCOSE 104* 168* 143*  BUN 17 13 11   CREATININE 0.86 0.94 0.80  CALCIUM 10.4* 9.1 9.0  MG 2.2 2.0 2.4   GFR Estimated Creatinine Clearance: 79.4 mL/min (by C-G formula based on SCr of 0.8 mg/dL). Liver Function Tests: Recent Labs  Lab 09/18/19 1551  AST 22  ALT 40  ALKPHOS 145*  BILITOT 0.4  PROT 8.2*  ALBUMIN 4.7   Recent Labs  Lab 09/18/19 1551  LIPASE 74*   No results for input(s): AMMONIA in the last 168 hours. Coagulation profile No results for input(s): INR, PROTIME in the last 168 hours.  CBC: Recent Labs  Lab 09/18/19 1551  WBC 10.6*  NEUTROABS 8.9*  HGB 12.4  HCT 37.0  MCV 95.6  PLT 340   Cardiac Enzymes: No results for input(s): CKTOTAL, CKMB, CKMBINDEX, TROPONINI in the last 168 hours. BNP: Invalid input(s): POCBNP CBG: No results for input(s): GLUCAP in the last 168 hours. D-Dimer No results for input(s): DDIMER in the last 72 hours. Hgb A1c No results for input(s): HGBA1C in the last 72 hours. Lipid Profile No results for input(s): CHOL, HDL, LDLCALC, TRIG, CHOLHDL, LDLDIRECT in the last 72  hours. Thyroid function studies Recent Labs    09/19/19 0323  TSH 2.790   Anemia work up No results for input(s): VITAMINB12, FOLATE, FERRITIN, TIBC, IRON, RETICCTPCT in the last 72 hours. Microbiology Recent Results (from the past 240 hour(s))  SARS CORONAVIRUS 2 (TAT 6-24 HRS) Nasopharyngeal Nasopharyngeal Swab     Status: None   Collection Time: 09/18/19  7:03 PM   Specimen: Nasopharyngeal Swab  Result Value Ref Range Status   SARS Coronavirus 2 NEGATIVE NEGATIVE Final    Comment: (NOTE) SARS-CoV-2 target nucleic acids are NOT DETECTED. The SARS-CoV-2 RNA is generally detectable in upper and lower respiratory specimens during the acute phase of infection. Negative results do not preclude SARS-CoV-2 infection, do not rule out co-infections with other pathogens, and should not be used as the sole basis for treatment or other patient management decisions. Negative results must be combined with clinical observations, patient history, and epidemiological information. The expected result is Negative. Fact Sheet for Patients: HairSlick.nohttps://www.fda.gov/media/138098/download Fact Sheet for Healthcare Providers: quierodirigir.comhttps://www.fda.gov/media/138095/download This test is not yet approved or cleared by the [place] FDA and  has been authorized for detection and/or diagnosis of SARS-CoV-2 by FDA under an Emergency Use Authorization (EUA). This EUA will remain  in effect (meaning this test can be used) for the duration of the COVID-19 declaration under Section 56 4(b)(1) of the Act, 21 U.S.C. section 360bbb-3(b)(1), unless the authorization is terminated or revoked sooner. Performed at [facility], [address], [place], [place] [address]      Discharge Instructions:   Discharge Instructions    Diet general   Complete by: As directed    Discharge instructions   Complete by: As directed    Can take zinc but avoid pumpkin seed   Increase activity slowly   Complete by: As  directed      Allergies as of 09/19/2019      Reactions   Norvasc [amlodipine Besylate] Shortness Of Breath, Nausea And Vomiting   chest tightness      Medication List    STOP taking these medications   losartan 50 MG tablet Commonly known as: COZAAR   zinc sulfate 220 (50 Zn) MG capsule     TAKE these medications   calcium carbonate 200 MG capsule Take 600 mg by mouth daily.   carbamazepine 200 MG tablet Commonly known as: TEGRETOL Take 1 tablet (200 mg total) by mouth 4 (four) times daily.   cholecalciferol 1000 units tablet Commonly known as: VITAMIN D Take 2,000 Units by mouth daily.   lisinopril 20 MG tablet Commonly known as: ZESTRIL Take 1 tablet (20 mg total) by mouth daily.   lithium carbonate 300 MG CR tablet Commonly known as: LITHOBID Take 1-2 tablets (300-600 mg total) by mouth See admin instructions. Take 300mg  in the morning and 600mg  in the evening on MON WED and FRI, then take 600mg  in the morning and 600mg  in the evening on all other days.   metoprolol tartrate 100 MG tablet Commonly known as: LOPRESSOR Take 1 tablet (100 mg total) by mouth 2 (two) times daily. Start taking on: September 20, 2019   multivitamin tablet Take 1 tablet by mouth daily.      Follow-up Information    L, PA-C Follow up in 1 week(s).   Specialty: Family Medicine Contact information: [address] [place] September 22, 2019 Tandy Gaw [phone]        [place]., MD Follow up.   Specialty: Psychiatry Why: for monitoring of your lithium Contact information: [address] [place] [phone] Lauraine Rinne [phone]            Time coordinating discharge: 35 min  Signed:  [place] DO  [facility] 09/19/2019, 4:53 PM

## 2019-09-19 NOTE — Telephone Encounter (Signed)
Patient called and said that she is in the hospital with lithium toxicity. She spoke to [employer] yesterday and he wanted to talk to her today. So please call her at [phone]

## 2019-09-20 LAB — LITHIUM LEVEL: Lithium Lvl: 2.1 mmol/L (ref 0.6–1.2)

## 2019-09-21 NOTE — Telephone Encounter (Signed)
Advised per your instructions to have her resume Lithium 300 Mg in the morning and 600 Mg in the evening on Monday, Wednesday and Friday and take 600 Mg in the morning and 600 Mg in the evening all other days when symptoms of toxicity are gone. Repeat labs in 2 weeks. Pt. And her mother verbalized understanding of instructions.

## 2019-09-21 NOTE — Telephone Encounter (Signed)
Patient had a recent brief episode of lithium toxicity with a level of 2.1  I spoke with the patient today she was discharged from the hospital after getting IV fluids.  Her lithium level was down to a normal range and her kidney function was normal.  Please call the patient and make sure she has improved and see if she has any questions.

## 2019-09-21 NOTE — Telephone Encounter (Signed)
Pt. Stated that she is having some muscle pains through out her body. She says she feels like her smell and taste is getting better and her walking is better. Pt. Wants to know when/how much Lithium should she take?

## 2019-10-04 ENCOUNTER — Encounter: Payer: Self-pay | Admitting: Psychiatry

## 2019-10-04 ENCOUNTER — Other Ambulatory Visit: Payer: Self-pay

## 2019-10-04 ENCOUNTER — Ambulatory Visit (INDEPENDENT_AMBULATORY_CARE_PROVIDER_SITE_OTHER): Payer: Self-pay | Admitting: Psychiatry

## 2019-10-04 DIAGNOSIS — Z79899 Other long term (current) drug therapy: Secondary | ICD-10-CM

## 2019-10-04 DIAGNOSIS — G251 Drug-induced tremor: Secondary | ICD-10-CM

## 2019-10-04 DIAGNOSIS — F319 Bipolar disorder, unspecified: Secondary | ICD-10-CM

## 2019-10-04 NOTE — Progress Notes (Signed)
Christine Cisneros [id number] 08/23/1971 48 y.o.  Subjective:   Patient ID:  Christine Cisneros is a 48 y.o. (DOB 06/22/1971) female.  Chief Complaint:  Chief Complaint  Patient presents with  . Follow-up    Medication Management  . Other    Bipolar 1   . Medication Problem    recent lithium toxicity    HPI Christine Cisneros presents to the office today for follow-up of bipolar disorder.    Last seen August 20, 2019.[name]. She was doing well no meds were changed.  She had a hospitalization for 24 hours on November 3 with lithium toxicity with a level of 2.32.  The pharmacist review of her meds and supplements suggested it was related to a zinc and pumpkin seed supplement.. Lithium level on DC November 4 = 1.17.  On November 6 she would advise to resume lithium at a lower dosage of 300 mg in the morning and 600 mg in the evening on Monday Wednesday and Friday and 600 mg twice daily on the other days of the week.  It was recommended to repeat lithium level in about 2 weeks from that date.  Recovered pretty well except started tremor in legs a little today.  And didn't have tremors before the toxicity.    No recent changes in BP meds lisinopril 20 and metoprolol 100 mg daily.  Mood is fine overall.  But is a little anxious over the situation.  Has chronic anxiety about being sick and it upset her sense of stability to have the episode of lithium toxicity.   She called July 22 reporting she had had palpitations and had a work-up by her physician.  Those labs were reviewed.  No med changes were made.    Lisinopril managed BP.  Palpitations resolved.  Patient reports stable mood and denies depressed or irritable moods.  No mania.  Patient denies any recent difficulty with anxiety.  Patient denies difficulty with sleep initiation or maintenance. Denies appetite disturbance.  Patient reports that energy and motivation have been good.  Patient denies any difficulty with concentration.  Patient denies any suicidal  ideation.  Past Psychiatric Medication Trials: Lithium 1200 daily, carbamazepine 400 twice daily, olanzapine, Geodon no response, Depakote weight gain and tired, Abilify 15, Ambien  Review of Systems:  Review of Systems  HENT: Negative for ear pain.   Cardiovascular: Negative for palpitations.  Neurological: Positive for tremors. Negative for weakness.    Medications: I have reviewed the patient's current medications.  Current Outpatient Medications  Medication Sig Dispense Refill  . calcium carbonate 200 MG capsule Take 600 mg by mouth daily.     . carbamazepine (TEGRETOL) 200 MG tablet Take 1 tablet (200 mg total) by mouth 4 (four) times daily. (Patient taking differently: Take 400 mg by mouth 2 (two) times daily. ) 360 tablet 1  . cholecalciferol (VITAMIN D) 1000 UNITS tablet Take 2,000 Units by mouth daily.     . Chromium 200 MCG TABS Take by mouth.    [name]. lisinopril (ZESTRIL) 20 MG tablet Take 1 tablet (20 mg total) by mouth daily. 90 tablet 1  . lithium carbonate (LITHOBID) 300 MG CR tablet Take 1-2 tablets (300-600 mg total) by mouth See admin instructions. Take 300mg  in the morning and 600mg  in the evening on MON WED and FRI, then take 600mg  in the morning and 600mg  in the evening on all other days.    . metoprolol tartrate (LOPRESSOR) 100 MG tablet Take 1 tablet (100 mg total) by  mouth 2 (two) times daily.    . Multiple Vitamin (MULTIVITAMIN) tablet Take 1 tablet by mouth daily.     No current facility-administered medications for this visit.     Medication Side Effects: None  Allergies:  Allergies  Allergen Reactions  . Norvasc [Amlodipine Besylate] Shortness Of Breath and Nausea And Vomiting    chest tightness    Past Medical History:  Diagnosis Date  . Bipolar 1 disorder ([facility])   . Hypertension     Family History  Problem Relation Age of Onset  . Cancer Father        melanoma  . Hyperlipidemia Father   . Diabetes Maternal Grandfather   . Diabetes Paternal  Grandmother     Social History   Socioeconomic History  . Marital status: Single    Spouse name: Not on file  . Number of children: Not on file  . Years of education: Not on file  . Highest education level: Not on file  Occupational History  . Occupation: Charity fundraiser  Social Needs  . Financial resource strain: Not on file  . Food insecurity    Worry: Not on file    Inability: Not on file  . Transportation needs    Medical: Not on file    Non-medical: Not on file  Tobacco Use  . Smoking status: Never Smoker  . Smokeless tobacco: Never Used  Substance and Sexual Activity  . Alcohol use: No    Alcohol/week: 0.0 standard drinks  . Drug use: No  . Sexual activity: Yes    Partners: Male  Lifestyle  . Physical activity    Days per week: Not on file    Minutes per session: Not on file  . Stress: Not on file  Relationships  . Social Musician on phone: Not on file    Gets together: Not on file    Attends religious service: Not on file    Active member of club or organization: Not on file    Attends meetings of clubs or organizations: Not on file    Relationship status: Not on file  . Intimate partner violence    Fear of current or ex partner: Not on file    Emotionally abused: Not on file    Physically abused: Not on file    Forced sexual activity: Not on file  Other Topics Concern  . Not on file  Social History Narrative  . Not on file    Past Medical History, Surgical history, Social history, and Family history were reviewed and updated as appropriate.   Please see review of systems for further details on the patient's review from today.   Objective:   Physical Exam:  There were no vitals taken for this visit.  Physical Exam Constitutional:      General: She is not in acute distress.    Appearance: Normal appearance. She is well-developed. She is obese.  Musculoskeletal:        General: No deformity.  Neurological:     Mental Status: She is alert and  oriented to person, place, and time.     Cranial Nerves: No dysarthria.     Motor: Tremor present.     Coordination: Coordination normal.     Comments: Mild fine tremor in the hands and in the right leg.  Balance is good  Psychiatric:        Attention and Perception: Attention and perception normal. She does not perceive auditory or visual hallucinations.  Mood and Affect: Mood is anxious. Mood is not depressed. Affect is not labile, blunt, angry or inappropriate.        Speech: Speech normal. Speech is not rapid and pressured or slurred.        Behavior: Behavior normal. Behavior is cooperative.        Thought Content: Thought content normal. Thought content is not paranoid or delusional. Thought content does not include homicidal or suicidal ideation. Thought content does not include homicidal or suicidal plan.        Cognition and Memory: Cognition and memory normal.        Judgment: Judgment normal.     Comments: Insight good.  Some anxiety over recent lithium toxicity     Lab Review:     Component Value Date/Time   NA 140 09/19/2019 1121   K 3.9 09/19/2019 1121   CL 113 (H) 09/19/2019 1121   CO2 21 (L) 09/19/2019 1121   GLUCOSE 143 (H) 09/19/2019 1121   BUN 11 09/19/2019 1121   CREATININE 0.80 09/19/2019 1121   CREATININE 0.87 06/01/2019 1545   CALCIUM 9.0 09/19/2019 1121   PROT 8.2 (H) 09/18/2019 1551   ALBUMIN 4.7 09/18/2019 1551   AST 22 09/18/2019 1551   ALT 40 09/18/2019 1551   ALKPHOS 145 (H) 09/18/2019 1551   BILITOT 0.4 09/18/2019 1551   GFRNONAA >60 09/19/2019 1121   GFRNONAA 79 06/01/2019 1545   GFRAA >60 09/19/2019 1121   GFRAA 91 06/01/2019 1545       Component Value Date/Time   WBC 10.6 (H) 09/18/2019 1551   RBC 3.87 09/18/2019 1551   HGB 12.4 09/18/2019 1551   HCT 37.0 09/18/2019 1551   PLT 340 09/18/2019 1551   MCV 95.6 09/18/2019 1551   MCH 32.0 09/18/2019 1551   MCHC 33.5 09/18/2019 1551   RDW 12.1 09/18/2019 1551   LYMPHSABS 1.0  09/18/2019 1551   MONOABS 0.6 09/18/2019 1551   EOSABS 0.1 09/18/2019 1551   BASOSABS 0.0 09/18/2019 1551    Lithium  Date Value Ref Range Status  08/02/2016 0.7  Final   Lithium Lvl  Date Value Ref Range Status  09/19/2019 1.17 0.60 - 1.20 mmol/L Final    Comment:    Performed at [facility], [place] [address], [place], [place] [address]     Lab Results  Component Value Date   CBMZ 4.0 09/19/2019     .res Assessment: Plan:    Bipolar I disorder ([place])  Lithium-induced tremor  Lithium use  Recent lithium toxicity.  Her bipolar disorder has been relatively stable for several years.  She had a couple of severe manic episodes around 2006 and had to be hospitalized for 1 of them.  Doing well with CBZ and lithium until the lithium toxicity November 3 with hospital stay..  Counseled patient regarding potential benefits, risks, and side effects of lithium to include potential risk of lithium affecting thyroid and renal function.  Discussed need for periodic lab monitoring to determine drug level and to assess for potential adverse effects.  Counseled patient regarding signs and symptoms of lithium toxicity and advised that they notify office immediately or seek urgent medical attention if experiencing these signs and symptoms.  Patient advised to contact office with any questions or concerns.  Discussed that some blood pressure meds can affect lithium levels and cause him to go up. She has mild tremor at the moment so we will need to get a lithium level right away.  She agrees.  Repeat lithium level and BMP tomorrow    Check lithium level monthly for a while.    Disc in detail risk factors for lithium toxicity:  FU 6-8 weeks  Meredith Staggers, MD, DFAPA  Please see After Visit Summary for patient specific instructions.  Future Appointments  Date Time Provider Department Center  10/10/2019  4:00 PM Cottle, Steva Ready., MD [facility] None  02/18/2020  9:30 AM Cottle, Steva Ready.,  MD [facility] None    No orders of the defined types were placed in this encounter.     -------------------------------

## 2019-10-06 LAB — BASIC METABOLIC PANEL
BUN: 16 mg/dL (ref 7–25)
CO2: 26 mmol/L (ref 20–32)
Calcium: 10.1 mg/dL (ref 8.6–10.2)
Chloride: 102 mmol/L (ref 98–110)
Creat: 0.91 mg/dL (ref 0.50–1.10)
Glucose, Bld: 100 mg/dL — ABNORMAL HIGH (ref 65–99)
Potassium: 5.4 mmol/L — ABNORMAL HIGH (ref 3.5–5.3)
Sodium: 137 mmol/L (ref 135–146)

## 2019-10-06 LAB — LITHIUM LEVEL: Lithium Lvl: 1.3 mmol/L — ABNORMAL HIGH (ref 0.6–1.2)

## 2019-10-08 ENCOUNTER — Telehealth: Payer: Self-pay | Admitting: Psychiatry

## 2019-10-08 NOTE — Progress Notes (Signed)
Pt. Made aware and verbalized understanding.

## 2019-10-08 NOTE — Telephone Encounter (Signed)
Patient aware see lab notes

## 2019-10-08 NOTE — Telephone Encounter (Signed)
PT is requesting lab results from Friday, 11/20

## 2019-10-10 ENCOUNTER — Ambulatory Visit: Payer: Self-pay | Admitting: Psychiatry

## 2019-10-15 ENCOUNTER — Other Ambulatory Visit: Payer: Self-pay | Admitting: Psychiatry

## 2019-10-15 DIAGNOSIS — F319 Bipolar disorder, unspecified: Secondary | ICD-10-CM

## 2019-10-30 ENCOUNTER — Telehealth: Payer: Self-pay

## 2019-10-30 ENCOUNTER — Other Ambulatory Visit: Payer: Self-pay

## 2019-10-30 LAB — LITHIUM LEVEL: Lithium Lvl: 1 mmol/L (ref 0.6–1.2)

## 2019-10-30 MED ORDER — LITHIUM CARBONATE 150 MG PO CAPS: 150.0000 mg | ORAL_CAPSULE | Freq: Every day | ORAL | 1 refills | Status: DC

## 2019-10-30 NOTE — Telephone Encounter (Signed)
Christine Cisneros called this morning asking about her lithium level, given results at 1.0, drawn yesterday. Pt just wants to verify continue same dose of 300 mg in am and 600 mg in pm Lithobid? Patient states normally you keep her around 0.6  Please advise

## 2019-10-30 NOTE — Telephone Encounter (Signed)
Patient given information on decreasing her Lithium dose to 750 mg/day. Rx for 150 mg capsule submitted to her [facility]. Instructed to follow up.

## 2019-10-30 NOTE — Telephone Encounter (Signed)
She is correct that her lithium level is usually a little lower than this.  It was 0.6 -6 months ago.  Lithium level 1 year ago was 0.7 and 3 years ago was 0.8.  In view of her episode of lithium toxicity fairly recently, it may be a good idea to reduce the dose of lithium from 900 mg daily to 750 mg daily by exchanging one of the 300 mg tablets with a 150 mg capsule.  That would be 2 of the 300 mg tablets that she currently has and 1 150 mg capsule.  They do not make 150 mg in tablet.  She can arrange the dose in whatever way she prefers such as 150 mg in the morning and 600 mg in the p.m. or 300 mg in the morning and 450 mg in the p.m.

## 2019-11-02 ENCOUNTER — Other Ambulatory Visit: Payer: Self-pay | Admitting: Physician Assistant

## 2019-11-02 DIAGNOSIS — I10 Essential (primary) hypertension: Secondary | ICD-10-CM

## 2019-11-02 NOTE — Telephone Encounter (Signed)
Needs appointment

## 2019-11-04 ENCOUNTER — Other Ambulatory Visit: Payer: Self-pay | Admitting: Psychiatry

## 2019-11-04 DIAGNOSIS — F319 Bipolar disorder, unspecified: Secondary | ICD-10-CM

## 2019-11-19 ENCOUNTER — Other Ambulatory Visit: Payer: Self-pay

## 2019-11-19 ENCOUNTER — Encounter: Payer: Self-pay | Admitting: Psychiatry

## 2019-11-19 ENCOUNTER — Other Ambulatory Visit: Payer: Self-pay | Admitting: Physician Assistant

## 2019-11-19 ENCOUNTER — Ambulatory Visit (INDEPENDENT_AMBULATORY_CARE_PROVIDER_SITE_OTHER): Payer: BLUE CROSS/BLUE SHIELD | Admitting: Psychiatry

## 2019-11-19 DIAGNOSIS — G251 Drug-induced tremor: Secondary | ICD-10-CM

## 2019-11-19 DIAGNOSIS — F319 Bipolar disorder, unspecified: Secondary | ICD-10-CM | POA: Diagnosis not present

## 2019-11-19 DIAGNOSIS — I10 Essential (primary) hypertension: Secondary | ICD-10-CM

## 2019-11-19 DIAGNOSIS — Z79899 Other long term (current) drug therapy: Secondary | ICD-10-CM | POA: Diagnosis not present

## 2019-11-19 MED ORDER — CARBAMAZEPINE ER 200 MG PO CP12: ORAL_CAPSULE | ORAL | 2 refills | Status: DC

## 2019-11-19 NOTE — Progress Notes (Signed)
Christine Cisneros [id number] 01/03/1971 49 y.o.  Subjective:   Patient ID:  Christine Cisneros is a 49 y.o. (DOB 08-29-1971) female.  Chief Complaint:  Chief Complaint  Patient presents with  . Follow-up    Medication Management  . Other    Medication Management recent lithium toxicity  . Medication Problem    HPI Christine Cisneros presents to the office today for follow-up of bipolar disorder.    when seen August 20, 2019.[name] She was doing well no meds were changed.  She had a hospitalization for 24 hours on November 3 with lithium toxicity with a level of 2.32.  The pharmacist review of her meds and supplements suggested it was related to a zinc and pumpkin seed supplement.. Lithium level on DC November 4 = 1.17.  On November 6 she would advise to resume lithium at a lower dosage of 300 mg in the morning and 600 mg in the evening on Monday Wednesday and Friday and 600 mg twice daily on the other days of the week.  It was recommended to repeat lithium level in about 2 weeks from that date.  No tremor or Sx lithium toxicity and lithium 1.0.  No mood sx either on. No recent med changes.  Recovered pretty well except started tremor in legs a little today.  And didn't have tremors before the toxicity.    No recent changes in BP meds lisinopril 20 and metoprolol 100 mg daily.  Mood is fine overall.  But is a little anxious over the situation.  Has chronic anxiety about being sick and it upset her sense of stability to have the episode of lithium toxicity.   She called July 22 reporting she had had palpitations and had a work-up by her physician.  Those labs were reviewed.  No med changes were made.    Lisinopril managed BP.  Palpitations resolved.  Patient reports stable mood and denies depressed or irritable moods.  No mania.  Patient denies any recent difficulty with anxiety.  Patient denies difficulty with sleep initiation or maintenance. Denies appetite disturbance.  Patient reports that energy and  motivation have been good.  Patient denies any difficulty with concentration.  Patient denies any suicidal ideation.  Changing PCP to [facility].  Past Psychiatric Medication Trials: Lithium 1200 daily, carbamazepine 400 twice daily, olanzapine, Geodon no response, Depakote weight gain and tired, Abilify 15, Ambien  Review of Systems:  Review of Systems  HENT: Negative for ear pain.   Cardiovascular: Negative for palpitations.  Neurological: Positive for tremors. Negative for weakness.    Medications: I have reviewed the patient's current medications.  Current Outpatient Medications  Medication Sig Dispense Refill  . calcium carbonate 200 MG capsule Take 600 mg by mouth daily.     . carbamazepine (TEGRETOL) 200 MG tablet Take 2 tablets (400 mg total) by mouth 2 (two) times daily. 360 tablet 1  . cholecalciferol (VITAMIN D) 1000 UNITS tablet Take 5,000 Units by mouth daily.     . Chromium 200 MCG TABS Take by mouth.    [name] lisinopril (ZESTRIL) 20 MG tablet Take 1 tablet (20 mg total) by mouth daily. 90 tablet 1  . lithium carbonate (LITHOBID) 300 MG CR tablet Take 2 tablets (600 mg total) by mouth at bedtime. (Patient taking differently: Take 300 mg by mouth 2 (two) times daily. ) 180 tablet 1  . lithium carbonate 150 MG capsule Take 1 capsule (150 mg total) by mouth at bedtime. Takes in addition to 600 mg  in the morning to equal 750 mg per day. 30 capsule 1  . metoprolol tartrate (LOPRESSOR) 100 MG tablet Take 1 tablet (100 mg total) by mouth 2 (two) times daily. NEEDS APPOINTMENT 180 tablet 0  . Multiple Vitamin (MULTIVITAMIN) tablet Take 1 tablet by mouth daily.    . carbamazepine (CARBATROL) 200 MG 12 hr capsule 2 in the AM and 3 in PM 150 capsule 2   No current facility-administered medications for this visit.    Medication Side Effects: None  Allergies:  Allergies  Allergen Reactions  . Norvasc [Amlodipine Besylate] Shortness Of Breath and Nausea And Vomiting    chest  tightness    Past Medical History:  Diagnosis Date  . Bipolar 1 disorder ([place])   . Hypertension     Family History  Problem Relation Age of Onset  . Cancer Father        melanoma  . Hyperlipidemia Father   . Diabetes Maternal Grandfather   . Diabetes Paternal Grandmother     Social History   Socioeconomic History  . Marital status: Single    Spouse name: Not on file  . Number of children: Not on file  . Years of education: Not on file  . Highest education level: Not on file  Occupational History  . Occupation: Therapist, sports  Tobacco Use  . Smoking status: Never Smoker  . Smokeless tobacco: Never Used  Substance and Sexual Activity  . Alcohol use: No    Alcohol/week: 0.0 standard drinks  . Drug use: No  . Sexual activity: Yes    Partners: Male  Other Topics Concern  . Not on file  Social History Narrative  . Not on file   Social Determinants of Health   Financial Resource Strain:   . Difficulty of Paying Living Expenses: Not on file  Food Insecurity:   . Worried About Charity fundraiser in the Last Year: Not on file  . Ran Out of Food in the Last Year: Not on file  Transportation Needs:   . Lack of Transportation (Medical): Not on file  . Lack of Transportation (Non-Medical): Not on file  Physical Activity:   . Days of Exercise per Week: Not on file  . Minutes of Exercise per Session: Not on file  Stress:   . Feeling of Stress : Not on file  Social Connections:   . Frequency of Communication with Friends and Family: Not on file  . Frequency of Social Gatherings with Friends and Family: Not on file  . Attends Religious Services: Not on file  . Active Member of Clubs or Organizations: Not on file  . Attends Archivist Meetings: Not on file  . Marital Status: Not on file  Intimate Partner Violence:   . Fear of Current or Ex-Partner: Not on file  . Emotionally Abused: Not on file  . Physically Abused: Not on file  . Sexually Abused: Not on file     Past Medical History, Surgical history, Social history, and Family history were reviewed and updated as appropriate.   Please see review of systems for further details on the patient's review from today.   Objective:   Physical Exam:  There were no vitals taken for this visit.  Physical Exam Constitutional:      General: She is not in acute distress.    Appearance: Normal appearance. She is well-developed. She is obese.  Musculoskeletal:        General: No deformity.  Neurological:  Mental Status: She is alert and oriented to person, place, and time.     Cranial Nerves: No dysarthria.     Motor: Tremor present.     Coordination: Coordination normal.     Comments: Mild fine tremor in the hands and in the right leg.  Balance is good  Psychiatric:        Attention and Perception: Attention and perception normal. She does not perceive auditory or visual hallucinations.        Mood and Affect: Mood is anxious. Mood is not depressed. Affect is not labile, blunt, angry or inappropriate.        Speech: Speech normal. Speech is not rapid and pressured or slurred.        Behavior: Behavior normal. Behavior is cooperative.        Thought Content: Thought content normal. Thought content is not paranoid or delusional. Thought content does not include homicidal or suicidal ideation. Thought content does not include homicidal or suicidal plan.        Cognition and Memory: Cognition and memory normal.        Judgment: Judgment normal.     Comments: Insight good.  Some anxiety over recent lithium toxicity     Lab Review:     Component Value Date/Time   NA 137 10/05/2019 0825   K 5.4 (H) 10/05/2019 0825   CL 102 10/05/2019 0825   CO2 26 10/05/2019 0825   GLUCOSE 100 (H) 10/05/2019 0825   BUN 16 10/05/2019 0825   CREATININE 0.91 10/05/2019 0825   CALCIUM 10.1 10/05/2019 0825   PROT 8.2 (H) 09/18/2019 1551   ALBUMIN 4.7 09/18/2019 1551   AST 22 09/18/2019 1551   ALT 40 09/18/2019  1551   ALKPHOS 145 (H) 09/18/2019 1551   BILITOT 0.4 09/18/2019 1551   GFRNONAA >60 09/19/2019 1121   GFRNONAA 79 06/01/2019 1545   GFRAA >60 09/19/2019 1121   GFRAA 91 06/01/2019 1545       Component Value Date/Time   WBC 10.6 (H) 09/18/2019 1551   RBC 3.87 09/18/2019 1551   HGB 12.4 09/18/2019 1551   HCT 37.0 09/18/2019 1551   PLT 340 09/18/2019 1551   MCV 95.6 09/18/2019 1551   MCH 32.0 09/18/2019 1551   MCHC 33.5 09/18/2019 1551   RDW 12.1 09/18/2019 1551   LYMPHSABS 1.0 09/18/2019 1551   MONOABS 0.6 09/18/2019 1551   EOSABS 0.1 09/18/2019 1551   BASOSABS 0.0 09/18/2019 1551    Lithium  Date Value Ref Range Status  08/02/2016 0.7  Final   Lithium Lvl  Date Value Ref Range Status  10/29/2019 1.0 0.6 - 1.2 mmol/L Final     Lab Results  Component Value Date   CBMZ 4.0 09/19/2019     .res Assessment: Plan:    Bipolar I disorder (HCC) - Plan: carbamazepine (CARBATROL) 200 MG 12 hr capsule, Lithium level  Lithium use  Lithium-induced tremor  Recent lithium toxicity.  Her bipolar disorder has been relatively stable for several years.  She had a couple of severe manic episodes around 2006 and had to be hospitalized for 1 of them.  Doing well with CBZ and lithium until the lithium toxicity November 3 with hospital stay..  Counseled patient regarding potential benefits, risks, and side effects of lithium to include potential risk of lithium affecting thyroid and renal function.  Discussed need for periodic lab monitoring to determine drug level and to assess for potential adverse effects.  Counseled patient regarding signs and   symptoms of lithium toxicity and advised that they notify office immediately or seek urgent medical attention if experiencing these signs and symptoms.  Patient advised to contact office with any questions or concerns.  Discussed that some blood pressure meds can affect lithium levels and cause him to go up. She has mild tremor at the moment so  we will need to get a lithium level right away.  She agrees.   Disc labs including CMP and elevated Alk Phos and suggest FU with PCP. Disc lithium level, CBZ level, etc. She's nervous about the lower lithium dosage with fear of mania.  Consider increase CBZ to compensate for lower lithium dose. Also low dose CBZ.  She agrees to increase CBZ and wants to switch to Carbatrol bc hx double vision with higher doses CBZ IR. Carbatrol XR 400 AM and 600 PM Disc SE  Check lithium level monthly for a while.    Disc in detail risk factors for lithium toxicity:  FU 6-8 weeks  Carey Cottle, MD, DFAPA  Please see After Visit Summary for patient specific instructions.  Future Appointments  Date Time Provider Department Center  02/18/2020  9:30 AM Cottle, Carey G Jr., MD [facility] None    Orders Placed This Encounter  Procedures  . Lithium level      ------------------------------- 

## 2019-11-20 ENCOUNTER — Other Ambulatory Visit (HOSPITAL_BASED_OUTPATIENT_CLINIC_OR_DEPARTMENT_OTHER): Payer: Self-pay | Admitting: Physician Assistant

## 2019-11-21 ENCOUNTER — Other Ambulatory Visit: Payer: Self-pay | Admitting: Psychiatry

## 2019-12-11 ENCOUNTER — Encounter: Payer: Self-pay | Admitting: Psychiatry

## 2019-12-11 ENCOUNTER — Telehealth: Payer: Self-pay | Admitting: Psychiatry

## 2019-12-11 ENCOUNTER — Other Ambulatory Visit: Payer: Self-pay | Admitting: Psychiatry

## 2019-12-11 NOTE — Progress Notes (Signed)
Pt. Made aware and verbalized understanding of instructions we discussed.

## 2019-12-11 NOTE — Progress Notes (Signed)
Instructions were to get lithium level rechecked in 3-4 weeks, continue meds as prescribed> Will not make any changes until next Li level is drawn.

## 2019-12-11 NOTE — Telephone Encounter (Signed)
Pt called about lithium levels dropping. Need to talk with nurse asap [phone].

## 2019-12-11 NOTE — Progress Notes (Signed)
Pt. Made aware. She is concerned that maybe her level is to low. No mood symptoms just wants to know if she should get another level drawn in a month.  She wants to know also if she can go back to taking her Carbamazepine. She is currently taking 1000 and wants to go back to 800 Mg. Please advise.

## 2019-12-11 NOTE — Progress Notes (Signed)
Pt. Is currently taking 1000 Mg of Carbamazepine total. (200 Mg)  2 in the morning and 3 in the evening. She currently takes Lithium as 750 Mg total. (300 mg) 2 at bedtime and (150 mg) 1 at Bedtime.

## 2019-12-11 NOTE — Progress Notes (Signed)
Lithium level 0.6 on reduced lithium dosage of 750 mg daily?  This is in the low normal range.  Last lithium level October 05, 2019 was 1.3.  While patient was taking 600 mg twice daily Monday Wednesday and Friday and 900 mg the other days of the week.  Therefore the dose was reduced to 900 mg dailyPatient had episode of lithium toxicity November 3 and was hospitalized.  Her lithium level was reduced from 1200 mg.  So far she has been stable.  No indication for med change.  Please inform patient that her lithium level was in the low normal range.  Verify that she is not having any unusual mood symptoms.  Please verify also that she is taking a total of 750 mg daily.

## 2019-12-11 NOTE — Telephone Encounter (Signed)
Noted Christine Cisneros just contacted her with information

## 2019-12-14 ENCOUNTER — Other Ambulatory Visit: Payer: Self-pay | Admitting: Psychiatry

## 2019-12-14 NOTE — Telephone Encounter (Signed)
I believe you kept her at 750 mg after her labs?

## 2019-12-14 NOTE — Telephone Encounter (Signed)
Patient remains on a total of lithium 750 mg daily..  She takes 2 lithium 300 mg capsules and one of the 150 mg capsules.

## 2020-01-07 ENCOUNTER — Other Ambulatory Visit: Payer: Self-pay | Admitting: Psychiatry

## 2020-01-08 ENCOUNTER — Telehealth: Payer: Self-pay | Admitting: Psychiatry

## 2020-01-08 LAB — LITHIUM LEVEL: Lithium Lvl: 0.5 mmol/L — ABNORMAL LOW (ref 0.6–1.2)

## 2020-01-08 NOTE — Progress Notes (Signed)
Her lithium level is too low at 0.5.[name]  Please verify what her current dosage is that is how much lithium is she currently taking?  Document that and then have her increase that dosage by 150 mg daily and we need to repeat the lithium level in 1 to 2 weeks.

## 2020-01-08 NOTE — Telephone Encounter (Signed)
Pt called in about her Lithium results and would like to know what she should do.  Lithium result was 0.5

## 2020-01-09 ENCOUNTER — Other Ambulatory Visit: Payer: Self-pay

## 2020-01-09 ENCOUNTER — Other Ambulatory Visit: Payer: Self-pay | Admitting: Psychiatry

## 2020-01-09 DIAGNOSIS — F319 Bipolar disorder, unspecified: Secondary | ICD-10-CM

## 2020-01-09 MED ORDER — CARBAMAZEPINE ER 200 MG PO CP12: ORAL_CAPSULE | ORAL | 2 refills | Status: DC

## 2020-01-09 NOTE — Telephone Encounter (Signed)
Pt left message last night about Lithium Level too low. Asking for advise on this matter. [phone]

## 2020-01-09 NOTE — Telephone Encounter (Signed)
Lithium level order sent to [facility]

## 2020-01-14 ENCOUNTER — Ambulatory Visit: Payer: BLUE CROSS/BLUE SHIELD | Admitting: Psychiatry

## 2020-01-21 ENCOUNTER — Telehealth: Payer: Self-pay | Admitting: Psychiatry

## 2020-01-21 NOTE — Telephone Encounter (Signed)
Pt called to report she had labs done and asking for a call with results [phone]. Went to [facility]. For Lithium level.

## 2020-01-21 NOTE — Telephone Encounter (Signed)
Call [facility] to get lithium level results since Feb 23.  Not received in Epic.

## 2020-01-22 ENCOUNTER — Other Ambulatory Visit: Payer: Self-pay | Admitting: Psychiatry

## 2020-01-22 DIAGNOSIS — F319 Bipolar disorder, unspecified: Secondary | ICD-10-CM

## 2020-01-22 LAB — LITHIUM LEVEL: Lithium Lvl: 0.7 mmol/L (ref 0.6–1.2)

## 2020-01-22 NOTE — Progress Notes (Signed)
She would like her labs rechecked in 2 weeks. Please send them to previous lab. Thanks

## 2020-01-22 NOTE — Progress Notes (Signed)
Lithium level is better at 0.7 on 900 mg daily.  That is in the normal range and is a reasonable blood level.  No med change indicated.  Please let her know.

## 2020-01-22 NOTE — Progress Notes (Signed)
Pls review.

## 2020-01-22 NOTE — Progress Notes (Signed)
This was sent to me by mistake

## 2020-01-22 NOTE — Telephone Encounter (Signed)
Found it and result note sent to be communicated to patient.

## 2020-01-28 ENCOUNTER — Other Ambulatory Visit: Payer: Self-pay | Admitting: Physician Assistant

## 2020-01-28 DIAGNOSIS — I10 Essential (primary) hypertension: Secondary | ICD-10-CM

## 2020-02-05 LAB — LITHIUM LEVEL: Lithium Lvl: 0.7 mmol/L (ref 0.6–1.2)

## 2020-02-06 NOTE — Progress Notes (Signed)
Lithium level is unchanged at 0.7 on 900 mg daily.  This is the same level as 2 weeks ago.  No change indicated.  It is in the normal range.

## 2020-02-06 NOTE — Progress Notes (Signed)
Pt. Made aware.

## 2020-02-06 NOTE — Progress Notes (Signed)
Left her a vm to return my call.

## 2020-02-18 ENCOUNTER — Ambulatory Visit (INDEPENDENT_AMBULATORY_CARE_PROVIDER_SITE_OTHER): Payer: BLUE CROSS/BLUE SHIELD | Admitting: Psychiatry

## 2020-02-18 ENCOUNTER — Ambulatory Visit: Payer: Self-pay | Admitting: Psychiatry

## 2020-02-18 ENCOUNTER — Encounter: Payer: Self-pay | Admitting: Psychiatry

## 2020-02-18 DIAGNOSIS — G251 Drug-induced tremor: Secondary | ICD-10-CM | POA: Diagnosis not present

## 2020-02-18 DIAGNOSIS — F319 Bipolar disorder, unspecified: Secondary | ICD-10-CM | POA: Diagnosis not present

## 2020-02-18 DIAGNOSIS — Z79899 Other long term (current) drug therapy: Secondary | ICD-10-CM

## 2020-02-18 MED ORDER — LITHIUM CARBONATE ER 300 MG PO TBCR: 900.0000 mg | EXTENDED_RELEASE_TABLET | Freq: Every day | ORAL | 1 refills | Status: DC

## 2020-02-18 MED ORDER — CARBAMAZEPINE ER 200 MG PO CP12: ORAL_CAPSULE | ORAL | 1 refills | Status: DC

## 2020-02-18 NOTE — Progress Notes (Signed)
Christine Cisneros [id number] Mar 01, 1971 49 y.o.   Virtual Visit via Telephone Note  I connected with pt by telephone and verified that I am speaking with the correct person using two identifiers.   I discussed the limitations, risks, security and privacy concerns of performing an evaluation and management service by telephone and the availability of in person appointments. I also discussed with the patient that there may be a patient responsible charge related to this service. The patient expressed understanding and agreed to proceed.  I discussed the assessment and treatment plan with the patient. The patient was provided an opportunity to ask questions and all were answered. The patient agreed with the plan and demonstrated an understanding of the instructions.   The patient was advised to call back or seek an in-person evaluation if the symptoms worsen or if the condition fails to improve as anticipated.  I provided 25 minutes of non-face-to-face time during this encounter. The call started at 930 and ended at 955. The patient was located at home and the provider was located office.   Subjective:   Patient ID:  Christine Cisneros is a 49 y.o. (DOB [birth year]) female.  Chief Complaint:  Chief Complaint  Patient presents with  . Follow-up  . Medication Problem    recentl lithium toxicity    HPI Christine Cisneros presents to the office today for follow-up of bipolar disorder.    when seen August 20, 2019.[name] She was doing well no meds were changed.  She had a hospitalization for 24 hours on November 3 with lithium toxicity with a level of 2.32.  The pharmacist review of her meds and supplements suggested it was related to a zinc and pumpkin seed supplement.. Lithium level on DC November 4 = 1.17.  On November 6 she would advise to resume lithium at a lower dosage of 300 mg in the morning and 600 mg in the evening on Monday Wednesday and Friday and 600 mg twice daily on the other days of the week.  It was  recommended to repeat lithium level in about 2 weeks from that date.  Last seen November 19, 2019.  The following was noted.  No tremor or Sx lithium toxicity and lithium 1.0.  No mood sx either on. No recent med changes. Recovered pretty well except started tremor in legs a little today.  And didn't have tremors before the toxicity.   Mood is fine overall.  But is a little anxious over the situation. Has chronic anxiety about being sick and it upset her sense of stability to have the episode of lithium toxicity.  Lithium level and BMP were ordered.  As of February 18, 2020: Several lithium levels have been obtained since her last appointment.  Last lithium level was 0.7 on February 04, 2020 at 900 mg daily.  That was the same level as 2 weeks prior and no meds were changed.  No problems since the last visit. Patient reports stable mood and denies depressed or irritable moods.  No mania.  Patient denies any recent difficulty with anxiety.  Patient denies difficulty with sleep initiation or maintenance. Denies appetite disturbance.  Patient reports that energy and motivation have been good.  Patient denies any difficulty with concentration.  Patient denies any suicidal ideation.  Changing PCP to [facility].  Past Psychiatric Medication Trials: Lithium 1200 daily, carbamazepine 400 twice daily, olanzapine, Geodon no response, Depakote weight gain and tired, Abilify 15, Ambien  Review of Systems:  Review of Systems  HENT:  Negative for ear pain.   Cardiovascular: Negative for palpitations.  Neurological: Positive for tremors. Negative for weakness.    Medications: I have reviewed the patient's current medications.  Current Outpatient Medications  Medication Sig Dispense Refill  . calcium carbonate 200 MG capsule Take 600 mg by mouth daily.     . cholecalciferol (VITAMIN D) 1000 UNITS tablet Take 5,000 Units by mouth daily.     . Chromium 200 MCG TABS Take by mouth.    [name] lisinopril (ZESTRIL) 20 MG  tablet Take 1 tablet (20 mg total) by mouth daily. 90 tablet 1  . metoprolol tartrate (LOPRESSOR) 100 MG tablet TAKE 1 TABLET (100 MG TOTAL) BY MOUTH 2 (TWO) TIMES DAILY. NEEDS APPOINTMENT 60 tablet 0  . Multiple Vitamin (MULTIVITAMIN) tablet Take 1 tablet by mouth daily.    . carbamazepine (CARBATROL) 200 MG 12 hr capsule 2 in the AM and 3 in PM 450 capsule 1  . lithium carbonate (LITHOBID) 300 MG CR tablet Take 3 tablets (900 mg total) by mouth daily. 270 tablet 1   No current facility-administered medications for this visit.    Medication Side Effects: None  Allergies:  Allergies  Allergen Reactions  . Norvasc [Amlodipine Besylate] Shortness Of Breath and Nausea And Vomiting    chest tightness    Past Medical History:  Diagnosis Date  . Bipolar 1 disorder ([place])   . Hypertension     Family History  Problem Relation Age of Onset  . Cancer Father        melanoma  . Hyperlipidemia Father   . Diabetes Maternal Grandfather   . Diabetes Paternal Grandmother     Social History   Socioeconomic History  . Marital status: Single    Spouse name: Not on file  . Number of children: Not on file  . Years of education: Not on file  . Highest education level: Not on file  Occupational History  . Occupation: Therapist, sports  Tobacco Use  . Smoking status: Never Smoker  . Smokeless tobacco: Never Used  Substance and Sexual Activity  . Alcohol use: No    Alcohol/week: 0.0 standard drinks  . Drug use: No  . Sexual activity: Yes    Partners: Male  Other Topics Concern  . Not on file  Social History Narrative  . Not on file   Social Determinants of Health   Financial Resource Strain:   . Difficulty of Paying Living Expenses:   Food Insecurity:   . Worried About Charity fundraiser in the Last Year:   . Arboriculturist in the Last Year:   Transportation Needs:   . Film/video editor (Medical):   [name] Lack of Transportation (Non-Medical):   Physical Activity:   . Days of Exercise per  Week:   . Minutes of Exercise per Session:   Stress:   . Feeling of Stress :   Social Connections:   . Frequency of Communication with Friends and Family:   . Frequency of Social Gatherings with Friends and Family:   . Attends Religious Services:   . Active Member of Clubs or Organizations:   . Attends Archivist Meetings:   [name] Marital Status:   Intimate Partner Violence:   . Fear of Current or Ex-Partner:   . Emotionally Abused:   [name] Physically Abused:   . Sexually Abused:     Past Medical History, Surgical history, Social history, and Family history were reviewed and updated as appropriate.   Please  see review of systems for further details on the patient's review from today.   Objective:   Physical Exam:  There were no vitals taken for this visit.  Physical Exam Constitutional:      General: She is not in acute distress.    Appearance: Normal appearance. She is well-developed. She is obese.  Musculoskeletal:        General: No deformity.  Neurological:     Mental Status: She is alert and oriented to person, place, and time.     Cranial Nerves: No dysarthria.     Motor: Tremor present.     Coordination: Coordination normal.     Comments: Mild fine tremor in the hands and in the right leg.  Balance is good  Psychiatric:        Attention and Perception: Attention and perception normal. She does not perceive auditory or visual hallucinations.        Mood and Affect: Mood is anxious. Mood is not depressed. Affect is not labile, blunt, angry or inappropriate.        Speech: Speech normal. Speech is not rapid and pressured or slurred.        Behavior: Behavior normal. Behavior is cooperative.        Thought Content: Thought content normal. Thought content is not paranoid or delusional. Thought content does not include homicidal or suicidal ideation. Thought content does not include homicidal or suicidal plan.        Cognition and Memory: Cognition and memory normal.         Judgment: Judgment normal.     Comments: Insight good. Less anxiety over recent lithium toxicity     Lab Review:     Component Value Date/Time   NA 137 10/05/2019 0825   K 5.4 (H) 10/05/2019 0825   CL 102 10/05/2019 0825   CO2 26 10/05/2019 0825   GLUCOSE 100 (H) 10/05/2019 0825   BUN 16 10/05/2019 0825   CREATININE 0.91 10/05/2019 0825   CALCIUM 10.1 10/05/2019 0825   PROT 8.2 (H) 09/18/2019 1551   ALBUMIN 4.7 09/18/2019 1551   AST 22 09/18/2019 1551   ALT 40 09/18/2019 1551   ALKPHOS 145 (H) 09/18/2019 1551   BILITOT 0.4 09/18/2019 1551   GFRNONAA >60 09/19/2019 1121   GFRNONAA 79 06/01/2019 1545   GFRAA >60 09/19/2019 1121   GFRAA 91 06/01/2019 1545       Component Value Date/Time   WBC 10.6 (H) 09/18/2019 1551   RBC 3.87 09/18/2019 1551   HGB 12.4 09/18/2019 1551   HCT 37.0 09/18/2019 1551   PLT 340 09/18/2019 1551   MCV 95.6 09/18/2019 1551   MCH 32.0 09/18/2019 1551   MCHC 33.5 09/18/2019 1551   RDW 12.1 09/18/2019 1551   LYMPHSABS 1.0 09/18/2019 1551   MONOABS 0.6 09/18/2019 1551   EOSABS 0.1 09/18/2019 1551   BASOSABS 0.0 09/18/2019 1551    Lithium  Date Value Ref Range Status  08/02/2016 0.7  Final   Lithium Lvl  Date Value Ref Range Status  02/04/2020 0.7 0.6 - 1.2 mmol/L Final    Comment:                                     Detection Limit = 0.1                           <  0.1 indicates None Detected      Lab Results  Component Value Date   CBMZ 4.0 09/19/2019     .res Assessment: Plan:    Bipolar I disorder ([place]) - Plan: Lithium level, carbamazepine (CARBATROL) 200 MG 12 hr capsule, lithium carbonate (LITHOBID) 300 MG CR tablet  Lithium use - Plan: Lithium level  Lithium-induced tremor  Recent lithium toxicity.  Her bipolar disorder has been relatively stable for several years.  She had a couple of severe manic episodes around 2006 and had to be hospitalized for 1 of them.  Doing well with CBZ and lithium until the lithium  toxicity November 3 with hospital stay.. Mood is stable.  She is on a lower dose of lithium but has a good blood level of 0.7 and that is been consistent over the last couple of checks.  Counseled patient regarding potential benefits, risks, and side effects of lithium to include potential risk of lithium affecting thyroid and renal function.  Discussed need for periodic lab monitoring to determine drug level and to assess for potential adverse effects.  Counseled patient regarding signs and symptoms of lithium toxicity and advised that they notify office immediately or seek urgent medical attention if experiencing these signs and symptoms.  Patient advised to contact office with any questions or concerns.  Discussed that some blood pressure meds can affect lithium levels and cause him to go up. She has mild tremor at the moment so we will need to get a lithium level right away.  She agrees.   Disc labs including CMP and elevated Alk Phos and suggest FU with PCP. Disc lithium level, CBZ level, etc. She's nervous about the lower lithium dosage with fear of mania. Consider increase CBZ to compensate for lower lithium dose. Also low dose CBZ.  She agrees to increase CBZ and wants to switch to Carbatrol bc hx double vision with higher doses CBZ IR. Carbatrol XR 400 AM and 600 PM Disc SE  Check lithium at one month after last level and then repeat every 3 mos She agrees.  Disc in detail risk factors for lithium toxicity:  FU 4 mos  Lynder Parents, MD, DFAPA  Please see After Visit Summary for patient specific instructions.  No future appointments.  Orders Placed This Encounter  Procedures  . Lithium level      -------------------------------

## 2020-03-03 ENCOUNTER — Other Ambulatory Visit: Payer: Self-pay | Admitting: Psychiatry

## 2020-03-04 LAB — LITHIUM LEVEL: Lithium Lvl: 0.6 mmol/L (ref 0.6–1.2)

## 2020-04-11 ENCOUNTER — Telehealth: Payer: Self-pay | Admitting: Psychiatry

## 2020-04-11 NOTE — Telephone Encounter (Signed)
Christine Cisneros called to request a lab order to have her lithium levels checked.  She wants to go Tuesday to get them done.  She has been experiencing vertigo and is wondering if the lithium levels are off.  Please fax the order to [facility] in [place] at [phone]

## 2020-04-13 NOTE — Telephone Encounter (Signed)
Patient has a standing order from April, will print out Tuesday morning when we return to office

## 2020-04-15 NOTE — Telephone Encounter (Signed)
Printed and faxed this morning

## 2020-04-20 ENCOUNTER — Telehealth: Payer: Self-pay

## 2020-04-20 NOTE — Telephone Encounter (Signed)
Prior authorization submitted and approved for CARBAMAZEPINE ER 200 MG CAPS effective 04/17/2020-04/17/2021 with [facility] ref# [id number]

## 2020-05-26 ENCOUNTER — Telehealth: Payer: Self-pay | Admitting: Psychiatry

## 2020-05-26 NOTE — Telephone Encounter (Signed)
Printed & faxed lab orders

## 2020-05-26 NOTE — Telephone Encounter (Signed)
Pt called need lab orders sent to [facility] for Lithium Level fax # [phone]

## 2020-05-26 NOTE — Telephone Encounter (Signed)
Thank you :)

## 2020-05-28 ENCOUNTER — Encounter: Payer: Self-pay | Admitting: Psychiatry

## 2020-05-29 NOTE — Progress Notes (Signed)
Lithium level 0.6 is OK.  No change needed. 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

## 2020-06-02 ENCOUNTER — Encounter: Payer: Self-pay | Admitting: Psychiatry

## 2020-06-02 ENCOUNTER — Ambulatory Visit (INDEPENDENT_AMBULATORY_CARE_PROVIDER_SITE_OTHER): Payer: BLUE CROSS/BLUE SHIELD | Admitting: Psychiatry

## 2020-06-02 ENCOUNTER — Other Ambulatory Visit: Payer: Self-pay

## 2020-06-02 DIAGNOSIS — Z79899 Other long term (current) drug therapy: Secondary | ICD-10-CM

## 2020-06-02 DIAGNOSIS — F319 Bipolar disorder, unspecified: Secondary | ICD-10-CM | POA: Diagnosis not present

## 2020-06-02 DIAGNOSIS — G251 Drug-induced tremor: Secondary | ICD-10-CM

## 2020-06-02 MED ORDER — CARBAMAZEPINE ER 200 MG PO CP12: ORAL_CAPSULE | ORAL | 1 refills | Status: DC

## 2020-06-02 MED ORDER — LITHIUM CARBONATE ER 300 MG PO TBCR: 900.0000 mg | EXTENDED_RELEASE_TABLET | Freq: Every day | ORAL | 1 refills | Status: DC

## 2020-06-02 NOTE — Progress Notes (Signed)
Christine Cisneros [id number] 01-31-1971 49 y.o.     Subjective:   Patient ID:  Christine Cisneros is a 49 y.o. (DOB 1971-07-02) female.  Chief Complaint:  Chief Complaint  Patient presents with  . Follow-up    bipolar and meds    HPI Christine Cisneros presents to the office today for follow-up of bipolar disorder.    when seen August 20, 2019.[name] She was doing well no meds were changed.  She had a hospitalization for 24 hours on November 3 with lithium toxicity with a level of 2.32.  The pharmacist review of her meds and supplements suggested it was related to a zinc and pumpkin seed supplement.. Lithium level on DC November 4 = 1.17.  On November 6 she would advise to resume lithium at a lower dosage of 300 mg in the morning and 600 mg in the evening on Monday Wednesday and Friday and 600 mg twice daily on the other days of the week.  It was recommended to repeat lithium level in about 2 weeks from that date.  seen November 19, 2019.  The following was noted.  No tremor or Sx lithium toxicity and lithium 1.0.  No mood sx either on. No recent med changes. Recovered pretty well except started tremor in legs a little today.  And didn't have tremors before the toxicity.   Mood is fine overall.  But is a little anxious over the situation. Has chronic anxiety about being sick and it upset her sense of stability to have the episode of lithium toxicity.  Lithium level and BMP were ordered.  As of appointment February 18, 2020: Several lithium levels have been obtained since her last appointment.  Last lithium level was 0.7 on February 04, 2020 at 900 mg daily.  That was the same level as 2 weeks prior and no meds were changed. No problems since the last visit. Because of recent problems with lithium toxicity the following was discussed: Disc labs including CMP and elevated Alk Phos and suggest FU with PCP. Disc lithium level, CBZ level, etc. She's nervous about the lower lithium dosage with fear of mania. Consider increase  CBZ to compensate for lower lithium dose. Also low dose CBZ.  She agrees to increase CBZ and wants to switch to Carbatrol bc hx double vision with higher doses CBZ IR. Carbatrol XR 400 AM and 600 PM Disc SE Check lithium at one month after last level and then repeat every 3 mos  04/11/2020 phone call patient experiencing vertigo wondering what her lithium levels were. Last lithium level received 0.6 from May 29, 2020 still on 900 mg daily with no med changes.  06/02/20 APPT with the following noted: Lab requests not being received at either lab, [facility] or [facility] when sent. No med changes since here.  Labs stable.  No mood swings.  Vertigo resolved after a few days.  No SE with either lithium 900 or CBZ ER 400 AM and 600PM.  Rare diarrhea with diet changes. 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 Now changed to [facility].    Patient reports stable mood and denies depressed or irritable moods.  No mania.  Patient denies any recent difficulty with anxiety.  Patient denies difficulty with sleep initiation or maintenance. Denies appetite disturbance.  Patient reports that energy and motivation have been good.  Patient denies any difficulty with concentration.  Patient denies any suicidal ideation.  Changing PCP to [facility].  Past  Psychiatric Medication Trials: Lithium 1200 daily, carbamazepine 400 twice daily, olanzapine, Geodon no response, Depakote weight gain and tired, Abilify 15, Ambien  Review of Systems:  Review of Systems  HENT: Negative for ear pain.   Cardiovascular: Negative for palpitations.  Gastrointestinal: Negative for diarrhea.  Neurological: Positive for tremors. Negative for weakness.    Medications: I have reviewed the patient's current medications.  Current Outpatient Medications  Medication Sig Dispense Refill  . calcium carbonate 200 MG capsule Take 600 mg by mouth daily.     . cholecalciferol (VITAMIN D) 1000 UNITS  tablet Take 5,000 Units by mouth daily.     . Chromium 200 MCG TABS Take by mouth.    [name] lisinopril (ZESTRIL) 20 MG tablet Take 1 tablet (20 mg total) by mouth daily. 90 tablet 1  . metoprolol tartrate (LOPRESSOR) 100 MG tablet TAKE 1 TABLET (100 MG TOTAL) BY MOUTH 2 (TWO) TIMES DAILY. NEEDS APPOINTMENT 60 tablet 0  . Multiple Vitamin (MULTIVITAMIN) tablet Take 1 tablet by mouth daily.    . carbamazepine (CARBATROL) 200 MG 12 hr capsule 2 in the AM and 3 in PM 450 capsule 1  . lithium carbonate (LITHOBID) 300 MG CR tablet Take 3 tablets (900 mg total) by mouth daily. 270 tablet 1   No current facility-administered medications for this visit.    Medication Side Effects: None  Allergies:  Allergies  Allergen Reactions  . Norvasc [Amlodipine Besylate] Shortness Of Breath and Nausea And Vomiting    chest tightness    Past Medical History:  Diagnosis Date  . Bipolar 1 disorder ([place])   . Hypertension     Family History  Problem Relation Age of Onset  . Cancer Father        melanoma  . Hyperlipidemia Father   . Diabetes Maternal Grandfather   . Diabetes Paternal Grandmother     Social History   Socioeconomic History  . Marital status: Single    Spouse name: Not on file  . Number of children: Not on file  . Years of education: Not on file  . Highest education level: Not on file  Occupational History  . Occupation: Therapist, sports  Tobacco Use  . Smoking status: Never Smoker  . Smokeless tobacco: Never Used  Substance and Sexual Activity  . Alcohol use: No    Alcohol/week: 0.0 standard drinks  . Drug use: No  . Sexual activity: Yes    Partners: Male  Other Topics Concern  . Not on file  Social History Narrative  . Not on file   Social Determinants of Health   Financial Resource Strain:   . Difficulty of Paying Living Expenses:   Food Insecurity:   . Worried About Charity fundraiser in the Last Year:   . Arboriculturist in the Last Year:   Transportation Needs:   . Lexicographer (Medical):   [name] Lack of Transportation (Non-Medical):   Physical Activity:   . Days of Exercise per Week:   . Minutes of Exercise per Session:   Stress:   . Feeling of Stress :   Social Connections:   . Frequency of Communication with Friends and Family:   . Frequency of Social Gatherings with Friends and Family:   . Attends Religious Services:   . Active Member of Clubs or Organizations:   . Attends Archivist Meetings:   [name] Marital Status:   Intimate Partner Violence:   . Fear of Current or Ex-Partner:   .  Emotionally Abused:   [name] Physically Abused:   . Sexually Abused:     Past Medical History, Surgical history, Social history, and Family history were reviewed and updated as appropriate.   Please see review of systems for further details on the patient's review from today.   Objective:   Physical Exam:  There were no vitals taken for this visit.  Physical Exam Constitutional:      General: She is not in acute distress.    Appearance: Normal appearance. She is well-developed. She is obese.  Musculoskeletal:        General: No deformity.  Neurological:     Mental Status: She is alert and oriented to person, place, and time.     Cranial Nerves: No dysarthria.     Motor: Tremor present.     Coordination: Coordination normal.     Comments: Mild fine tremor in the hands and in the right leg.  Balance is good  Psychiatric:        Attention and Perception: Attention and perception normal. She does not perceive auditory or visual hallucinations.        Mood and Affect: Mood is not anxious or depressed. Affect is not labile, blunt, angry, tearful or inappropriate.        Speech: Speech normal. Speech is not rapid and pressured or slurred.        Behavior: Behavior normal. Behavior is cooperative.        Thought Content: Thought content normal. Thought content is not paranoid or delusional. Thought content does not include homicidal or suicidal ideation.  Thought content does not include homicidal or suicidal plan.        Cognition and Memory: Cognition and memory normal.        Judgment: Judgment normal.     Comments: Insight good.     Lab Review:     Component Value Date/Time   NA 137 10/05/2019 0825   K 5.4 (H) 10/05/2019 0825   CL 102 10/05/2019 0825   CO2 26 10/05/2019 0825   GLUCOSE 100 (H) 10/05/2019 0825   BUN 16 10/05/2019 0825   CREATININE 0.91 10/05/2019 0825   CALCIUM 10.1 10/05/2019 0825   PROT 8.2 (H) 09/18/2019 1551   ALBUMIN 4.7 09/18/2019 1551   AST 22 09/18/2019 1551   ALT 40 09/18/2019 1551   ALKPHOS 145 (H) 09/18/2019 1551   BILITOT 0.4 09/18/2019 1551   GFRNONAA >60 09/19/2019 1121   GFRNONAA 79 06/01/2019 1545   GFRAA >60 09/19/2019 1121   GFRAA 91 06/01/2019 1545       Component Value Date/Time   WBC 10.6 (H) 09/18/2019 1551   RBC 3.87 09/18/2019 1551   HGB 12.4 09/18/2019 1551   HCT 37.0 09/18/2019 1551   PLT 340 09/18/2019 1551   MCV 95.6 09/18/2019 1551   MCH 32.0 09/18/2019 1551   MCHC 33.5 09/18/2019 1551   RDW 12.1 09/18/2019 1551   LYMPHSABS 1.0 09/18/2019 1551   MONOABS 0.6 09/18/2019 1551   EOSABS 0.1 09/18/2019 1551   BASOSABS 0.0 09/18/2019 1551    Lithium  Date Value Ref Range Status  08/02/2016 0.7  Final   Lithium Lvl  Date Value Ref Range Status  03/03/2020 0.6 0.6 - 1.2 mmol/L Final    Comment:                                     Detection  Limit = 0.1                           <0.1 indicates None Detected   March 03, 2020 lithium level 0.6 on 900 mg daily.  No med changes  Lab Results  Component Value Date   CBMZ 4.0 09/19/2019     .res Assessment: Plan:    Bipolar I disorder ([place]) - Plan: Basic metabolic panel, Lithium level, TSH, Basic metabolic panel, Lithium level, TSH, Basic metabolic panel, Lithium level, TSH, Basic metabolic panel, Lithium level, TSH, carbamazepine (CARBATROL) 200 MG 12 hr capsule, lithium carbonate (LITHOBID) 300 MG CR tablet  Lithium  use - Plan: Basic metabolic panel, Lithium level, TSH, Basic metabolic panel, Lithium level, TSH, Basic metabolic panel, Lithium level, TSH, Basic metabolic panel, Lithium level, TSH  Lithium-induced tremor   lithium toxicity 09/2019.  Her bipolar disorder has been relatively stable for several years.  She had a couple of severe manic episodes around 2006 and had to be hospitalized for 1 of them.  lithium toxicity 09/2019.  Lithium dose reduced from 1200 to 900 mg daily.  Doing well with CBZ and lithium until the lithium toxicity November 3 with hospital stay.. Mood is stable.  She is on a lower dose of lithium but has a good blood level of 0.7 and that is been consistent over the last couple of checks.  Counseled patient regarding potential benefits, risks, and side effects of lithium to include potential risk of lithium affecting thyroid and renal function.  Discussed need for periodic lab monitoring to determine drug level and to assess for potential adverse effects.  Counseled patient regarding signs and symptoms of lithium toxicity and advised that they notify office immediately or seek urgent medical attention if experiencing these signs and symptoms.  Patient advised to contact office with any questions or concerns.  Discussed that some blood pressure meds can affect lithium levels and cause him to go up. Rare lithium tremor resolved.   Disc lithium level, CBZ level, etc. She's nervous about the lower lithium dosage with fear of mania. Mania has not recurred with less lithium.   Consider increase CBZ to compensate for lower lithium dose if needed. Also low dose CBZ.  Stable mood. Carbatrol XR 400 AM and 600 PM Disc SE .  Vertigo resolved.  Check lithium at one month after last level and then repeat every 3 mos March 03, 2020 lithium level 0.6 on 900 mg daily.  No med changes Last lithium level received 0.6 from May 29, 2020 still on 900 mg daily with no med changes. She agrees.  Disc  in detail risk factors for lithium toxicity:  FU 4 mos  Christine Parents, MD, DFAPA  Please see After Visit Summary for patient specific instructions.  No future appointments.  Orders Placed This Encounter  Procedures  . Basic metabolic panel  . Lithium level  . TSH  . Basic metabolic panel  . Lithium level  . TSH  . Basic metabolic panel  . Lithium level  . TSH  . Basic metabolic panel  . Lithium level  . TSH      -------------------------------

## 2020-06-02 NOTE — Addendum Note (Signed)
Addended by: Kirstie Peri on: 06/02/2020 10:30 AM   Modules accepted: Orders

## 2020-08-18 ENCOUNTER — Other Ambulatory Visit: Payer: Self-pay | Admitting: Psychiatry

## 2020-08-18 DIAGNOSIS — F319 Bipolar disorder, unspecified: Secondary | ICD-10-CM

## 2020-08-28 ENCOUNTER — Telehealth: Payer: Self-pay | Admitting: Psychiatry

## 2020-08-28 NOTE — Telephone Encounter (Signed)
Christine Cisneros called today and said she is going to [facility] to get her lithium level this am. [facility] never receives our labs so this was faxed to them.She is not sure if she needs another lab in the near future. If so, please print it out and fax it to her at her home number. Her fax is [phone]. Thanks.

## 2020-08-29 LAB — LITHIUM LEVEL: Lithium Lvl: 0.8 mmol/L (ref 0.5–1.2)

## 2020-11-13 DIAGNOSIS — M50321 Other cervical disc degeneration at C4-C5 level: Secondary | ICD-10-CM | POA: Diagnosis not present

## 2020-11-13 DIAGNOSIS — M50322 Other cervical disc degeneration at C5-C6 level: Secondary | ICD-10-CM | POA: Diagnosis not present

## 2020-11-13 DIAGNOSIS — M5136 Other intervertebral disc degeneration, lumbar region: Secondary | ICD-10-CM | POA: Diagnosis not present

## 2020-11-13 DIAGNOSIS — M9903 Segmental and somatic dysfunction of lumbar region: Secondary | ICD-10-CM | POA: Diagnosis not present

## 2020-11-13 DIAGNOSIS — M50323 Other cervical disc degeneration at C6-C7 level: Secondary | ICD-10-CM | POA: Diagnosis not present

## 2020-11-13 DIAGNOSIS — M9901 Segmental and somatic dysfunction of cervical region: Secondary | ICD-10-CM | POA: Diagnosis not present

## 2020-11-13 DIAGNOSIS — M531 Cervicobrachial syndrome: Secondary | ICD-10-CM | POA: Diagnosis not present

## 2020-11-13 DIAGNOSIS — M9904 Segmental and somatic dysfunction of sacral region: Secondary | ICD-10-CM | POA: Diagnosis not present

## 2020-11-13 DIAGNOSIS — M9905 Segmental and somatic dysfunction of pelvic region: Secondary | ICD-10-CM | POA: Diagnosis not present

## 2020-11-13 DIAGNOSIS — Q72891 Other reduction defects of right lower limb: Secondary | ICD-10-CM | POA: Diagnosis not present

## 2020-11-13 DIAGNOSIS — M5137 Other intervertebral disc degeneration, lumbosacral region: Secondary | ICD-10-CM | POA: Diagnosis not present

## 2020-11-13 DIAGNOSIS — M47814 Spondylosis without myelopathy or radiculopathy, thoracic region: Secondary | ICD-10-CM | POA: Diagnosis not present

## 2020-11-17 ENCOUNTER — Ambulatory Visit: Payer: BLUE CROSS/BLUE SHIELD | Admitting: Psychiatry

## 2020-11-20 DIAGNOSIS — M9901 Segmental and somatic dysfunction of cervical region: Secondary | ICD-10-CM | POA: Diagnosis not present

## 2020-11-20 DIAGNOSIS — I10 Essential (primary) hypertension: Secondary | ICD-10-CM | POA: Diagnosis not present

## 2020-11-20 DIAGNOSIS — M9903 Segmental and somatic dysfunction of lumbar region: Secondary | ICD-10-CM | POA: Diagnosis not present

## 2020-11-20 DIAGNOSIS — M50322 Other cervical disc degeneration at C5-C6 level: Secondary | ICD-10-CM | POA: Diagnosis not present

## 2020-11-20 DIAGNOSIS — M531 Cervicobrachial syndrome: Secondary | ICD-10-CM | POA: Diagnosis not present

## 2020-11-20 DIAGNOSIS — Q72891 Other reduction defects of right lower limb: Secondary | ICD-10-CM | POA: Diagnosis not present

## 2020-11-20 DIAGNOSIS — M50323 Other cervical disc degeneration at C6-C7 level: Secondary | ICD-10-CM | POA: Diagnosis not present

## 2020-11-20 DIAGNOSIS — M9904 Segmental and somatic dysfunction of sacral region: Secondary | ICD-10-CM | POA: Diagnosis not present

## 2020-11-20 DIAGNOSIS — M5136 Other intervertebral disc degeneration, lumbar region: Secondary | ICD-10-CM | POA: Diagnosis not present

## 2020-11-20 DIAGNOSIS — M50321 Other cervical disc degeneration at C4-C5 level: Secondary | ICD-10-CM | POA: Diagnosis not present

## 2020-11-20 DIAGNOSIS — H60543 Acute eczematoid otitis externa, bilateral: Secondary | ICD-10-CM | POA: Diagnosis not present

## 2020-11-20 DIAGNOSIS — M9905 Segmental and somatic dysfunction of pelvic region: Secondary | ICD-10-CM | POA: Diagnosis not present

## 2020-11-20 DIAGNOSIS — Z79899 Other long term (current) drug therapy: Secondary | ICD-10-CM | POA: Diagnosis not present

## 2020-11-20 DIAGNOSIS — M47814 Spondylosis without myelopathy or radiculopathy, thoracic region: Secondary | ICD-10-CM | POA: Diagnosis not present

## 2020-11-20 DIAGNOSIS — R69 Illness, unspecified: Secondary | ICD-10-CM | POA: Diagnosis not present

## 2020-11-20 DIAGNOSIS — E782 Mixed hyperlipidemia: Secondary | ICD-10-CM | POA: Diagnosis not present

## 2020-11-20 DIAGNOSIS — M5137 Other intervertebral disc degeneration, lumbosacral region: Secondary | ICD-10-CM | POA: Diagnosis not present

## 2020-11-24 ENCOUNTER — Ambulatory Visit (INDEPENDENT_AMBULATORY_CARE_PROVIDER_SITE_OTHER): Payer: 59 | Admitting: Psychiatry

## 2020-11-24 ENCOUNTER — Other Ambulatory Visit: Payer: Self-pay

## 2020-11-24 ENCOUNTER — Encounter: Payer: Self-pay | Admitting: Psychiatry

## 2020-11-24 DIAGNOSIS — F319 Bipolar disorder, unspecified: Secondary | ICD-10-CM

## 2020-11-24 DIAGNOSIS — M50323 Other cervical disc degeneration at C6-C7 level: Secondary | ICD-10-CM | POA: Diagnosis not present

## 2020-11-24 DIAGNOSIS — M5136 Other intervertebral disc degeneration, lumbar region: Secondary | ICD-10-CM | POA: Diagnosis not present

## 2020-11-24 DIAGNOSIS — Z79899 Other long term (current) drug therapy: Secondary | ICD-10-CM | POA: Diagnosis not present

## 2020-11-24 DIAGNOSIS — G251 Drug-induced tremor: Secondary | ICD-10-CM

## 2020-11-24 DIAGNOSIS — M9901 Segmental and somatic dysfunction of cervical region: Secondary | ICD-10-CM | POA: Diagnosis not present

## 2020-11-24 DIAGNOSIS — M5137 Other intervertebral disc degeneration, lumbosacral region: Secondary | ICD-10-CM | POA: Diagnosis not present

## 2020-11-24 DIAGNOSIS — M9903 Segmental and somatic dysfunction of lumbar region: Secondary | ICD-10-CM | POA: Diagnosis not present

## 2020-11-24 DIAGNOSIS — M531 Cervicobrachial syndrome: Secondary | ICD-10-CM | POA: Diagnosis not present

## 2020-11-24 DIAGNOSIS — M47814 Spondylosis without myelopathy or radiculopathy, thoracic region: Secondary | ICD-10-CM | POA: Diagnosis not present

## 2020-11-24 DIAGNOSIS — Q72891 Other reduction defects of right lower limb: Secondary | ICD-10-CM | POA: Diagnosis not present

## 2020-11-24 DIAGNOSIS — M9905 Segmental and somatic dysfunction of pelvic region: Secondary | ICD-10-CM | POA: Diagnosis not present

## 2020-11-24 DIAGNOSIS — M9904 Segmental and somatic dysfunction of sacral region: Secondary | ICD-10-CM | POA: Diagnosis not present

## 2020-11-24 DIAGNOSIS — M50322 Other cervical disc degeneration at C5-C6 level: Secondary | ICD-10-CM | POA: Diagnosis not present

## 2020-11-24 DIAGNOSIS — R69 Illness, unspecified: Secondary | ICD-10-CM | POA: Diagnosis not present

## 2020-11-24 DIAGNOSIS — M50321 Other cervical disc degeneration at C4-C5 level: Secondary | ICD-10-CM | POA: Diagnosis not present

## 2020-11-24 NOTE — Progress Notes (Signed)
Christine Cisneros [id number] 1971-05-04 50 y.o.     Subjective:   Patient ID:  Christine Cisneros is a 50 y.o. (DOB 09-Mar-1971) female.  Chief Complaint:  Chief Complaint  Patient presents with  . Follow-up    Mood and meds    HPI Christine Cisneros presents to the office today for follow-up of bipolar disorder.    when seen August 20, 2019.[name] She was doing well no meds were changed.  She had a hospitalization for 24 hours on November 3 with lithium toxicity with a level of 2.32.  The pharmacist review of her meds and supplements suggested it was related to a zinc and pumpkin seed supplement.. Lithium level on DC November 4 = 1.17.  On November 6 she would advise to resume lithium at a lower dosage of 300 mg in the morning and 600 mg in the evening on Monday Wednesday and Friday and 600 mg twice daily on the other days of the week.  It was recommended to repeat lithium level in about 2 weeks from that date.  seen November 19, 2019.  The following was noted.  No tremor or Sx lithium toxicity and lithium 1.0.  No mood sx either on. No recent med changes. Recovered pretty well except started tremor in legs a little today.  And didn't have tremors before the toxicity.   Mood is fine overall.  But is a little anxious over the situation. Has chronic anxiety about being sick and it upset her sense of stability to have the episode of lithium toxicity.  Lithium level and BMP were ordered.  As of appointment February 18, 2020: Several lithium levels have been obtained since her last appointment.  Last lithium level was 0.7 on February 04, 2020 at 900 mg daily.  That was the same level as 2 weeks prior and no meds were changed. No problems since the last visit. Because of recent problems with lithium toxicity the following was discussed: Disc labs including CMP and elevated Alk Phos and suggest FU with PCP. Disc lithium level, CBZ level, etc. She's nervous about the lower lithium dosage with fear of mania. Consider increase CBZ  to compensate for lower lithium dose. Also low dose CBZ.  She agrees to increase CBZ and wants to switch to Carbatrol bc hx double vision with higher doses CBZ IR. Carbatrol XR 400 AM and 600 PM Disc SE Check lithium at one month after last level and then repeat every 3 mos  04/11/2020 phone call patient experiencing vertigo wondering what her lithium levels were. Last lithium level received 0.6 from May 29, 2020 still on 900 mg daily with no med changes.  06/02/20 APPT with the following noted: Lab requests not being received at either lab, [facility] or [facility] when sent. No med changes since here.  Labs stable.  No mood swings.  Vertigo resolved after a few days.  No SE with either lithium 900 or CBZ ER 400 AM and 600PM.  Rare diarrhea with diet changes. 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 Now changed to [name].   No med changes  11/24/20 appt noted: Parents doing well and good Xmas. Disc concern from PCP who asked about lithium.  She then had another doctor question her use of lithium but without any specific reason.  Wonders if she should continue lithium.  Discussed the doubt that that created.  Patient reports stable mood and denies depressed or irritable moods.  No mania.  Patient denies any recent difficulty with anxiety.  Patient denies difficulty with sleep initiation or maintenance. Denies appetite disturbance.  Patient reports that energy and motivation have been good.  Patient denies any difficulty with concentration.  Patient denies any suicidal ideation.  Changing PCP to [name].  Past Psychiatric Medication Trials: Lithium 1200 daily, carbamazepine 400 twice daily, olanzapine, Geodon no response, Depakote weight gain and tired, Abilify 15, Ambien  Review of Systems:  Review of Systems  HENT: Negative for ear pain.   Cardiovascular: Negative for palpitations.  Gastrointestinal: Negative for diarrhea.  Neurological:  Negative for tremors and weakness.    Medications: I have reviewed the patient's current medications.  Current Outpatient Medications  Medication Sig Dispense Refill  . calcium carbonate 200 MG capsule Take 600 mg by mouth daily.     . carbamazepine (CARBATROL) 200 MG 12 hr capsule 2 in the AM and 3 in PM 450 capsule 1  . cholecalciferol (VITAMIN D) 1000 UNITS tablet Take 5,000 Units by mouth daily.     . Chromium 200 MCG TABS Take by mouth.    [name] lisinopril (ZESTRIL) 20 MG tablet Take 1 tablet (20 mg total) by mouth daily. 90 tablet 1  . lithium carbonate (LITHOBID) 300 MG CR tablet Take 3 tablets (900 mg total) by mouth daily. 270 tablet 1  . metoprolol tartrate (LOPRESSOR) 100 MG tablet TAKE 1 TABLET (100 MG TOTAL) BY MOUTH 2 (TWO) TIMES DAILY. NEEDS APPOINTMENT 60 tablet 0  . Multiple Vitamin (MULTIVITAMIN) tablet Take 1 tablet by mouth daily.     No current facility-administered medications for this visit.    Medication Side Effects: None  Allergies:  Allergies  Allergen Reactions  . Norvasc [Amlodipine Besylate] Shortness Of Breath and Nausea And Vomiting    chest tightness    Past Medical History:  Diagnosis Date  . Bipolar 1 disorder ([place])   . Hypertension     Family History  Problem Relation Age of Onset  . Cancer Father        melanoma  . Hyperlipidemia Father   . Diabetes Maternal Grandfather   . Diabetes Paternal Grandmother     Social History   Socioeconomic History  . Marital status: Single    Spouse name: Not on file  . Number of children: Not on file  . Years of education: Not on file  . Highest education level: Not on file  Occupational History  . Occupation: Therapist, sports  Tobacco Use  . Smoking status: Never Smoker  . Smokeless tobacco: Never Used  Substance and Sexual Activity  . Alcohol use: No    Alcohol/week: 0.0 standard drinks  . Drug use: No  . Sexual activity: Yes    Partners: Male  Other Topics Concern  . Not on file  Social History  Narrative  . Not on file   Social Determinants of Health   Financial Resource Strain: Not on file  Food Insecurity: Not on file  Transportation Needs: Not on file  Physical Activity: Not on file  Stress: Not on file  Social Connections: Not on file  Intimate Partner Violence: Not on file    Past Medical History, Surgical history, Social history, and Family history were reviewed and updated as appropriate.   Please see review of systems for further details on the patient's review from today.   Objective:   Physical Exam:  There were no vitals taken for this visit.  Physical Exam Constitutional:      General: She is  not in acute distress.    Appearance: Normal appearance. She is well-developed. She is obese.  Musculoskeletal:        General: No deformity.  Neurological:     Mental Status: She is alert and oriented to person, place, and time.     Cranial Nerves: No dysarthria.     Motor: Tremor present.     Coordination: Coordination normal.     Comments: Mild fine tremor in the hands and in the right leg.  Balance is good  Psychiatric:        Attention and Perception: Attention and perception normal. She does not perceive auditory or visual hallucinations.        Mood and Affect: Mood is not anxious or depressed. Affect is not labile, blunt, angry, tearful or inappropriate.        Speech: Speech normal. Speech is not rapid and pressured or slurred.        Behavior: Behavior normal. Behavior is not aggressive. Behavior is cooperative.        Thought Content: Thought content normal. Thought content is not paranoid or delusional. Thought content does not include homicidal or suicidal ideation. Thought content does not include homicidal or suicidal plan.        Cognition and Memory: Cognition and memory normal.        Judgment: Judgment normal.     Comments: Insight good.     Lab Review:     Component Value Date/Time   NA 137 10/05/2019 0825   K 5.4 (H) 10/05/2019 0825    CL 102 10/05/2019 0825   CO2 26 10/05/2019 0825   GLUCOSE 100 (H) 10/05/2019 0825   BUN 16 10/05/2019 0825   CREATININE 0.91 10/05/2019 0825   CALCIUM 10.1 10/05/2019 0825   PROT 8.2 (H) 09/18/2019 1551   ALBUMIN 4.7 09/18/2019 1551   AST 22 09/18/2019 1551   ALT 40 09/18/2019 1551   ALKPHOS 145 (H) 09/18/2019 1551   BILITOT 0.4 09/18/2019 1551   GFRNONAA >60 09/19/2019 1121   GFRNONAA 79 06/01/2019 1545   GFRAA >60 09/19/2019 1121   GFRAA 91 06/01/2019 1545       Component Value Date/Time   WBC 10.6 (H) 09/18/2019 1551   RBC 3.87 09/18/2019 1551   HGB 12.4 09/18/2019 1551   HCT 37.0 09/18/2019 1551   PLT 340 09/18/2019 1551   MCV 95.6 09/18/2019 1551   MCH 32.0 09/18/2019 1551   MCHC 33.5 09/18/2019 1551   RDW 12.1 09/18/2019 1551   LYMPHSABS 1.0 09/18/2019 1551   MONOABS 0.6 09/18/2019 1551   EOSABS 0.1 09/18/2019 1551   BASOSABS 0.0 09/18/2019 1551    Lithium  Date Value Ref Range Status  08/02/2016 0.7  Final   Lithium Lvl  Date Value Ref Range Status  08/28/2020 0.8 0.5 - 1.2 mmol/L Final    Comment:    Plasma concentration of 0.5 - 0.8 mmol/L are advised for long-term use; concentrations of up to 1.2 mmol/L may be necessary during acute treatment.                                  Detection Limit = 0.1                           <0.1 indicates None Detected   March 03, 2020 lithium level 0.6 on 900 mg daily.  No med  changes  Lab Results  Component Value Date   CBMZ 4.0 09/19/2019     .res Assessment: Plan:    Bipolar I disorder ([place]) - Plan: Basic metabolic panel, Lithium level  Lithium-induced tremor - Plan: Basic metabolic panel, Lithium level  Lithium use - Plan: Basic metabolic panel, Lithium level   lithium toxicity 09/2019.  Her bipolar disorder has been relatively stable for several years.  She had a couple of severe manic episodes around 2006 and had to be hospitalized for 1 of them.  lithium toxicity 09/2019.  Lithium dose reduced from  1200 to 900 mg daily.  Doing well with CBZ and lithium until the lithium toxicity November 3 with hospital stay.. Mood is stable.  She is on a lower dose of lithium but has a good blood level of 0.7 and that is been consistent over the last couple of checks.  Counseled patient regarding potential benefits, risks, and side effects of lithium to include potential risk of lithium affecting thyroid and renal function.  Discussed need for periodic lab monitoring to determine drug level and to assess for potential adverse effects.  Counseled patient regarding signs and symptoms of lithium toxicity and advised that they notify office immediately or seek urgent medical attention if experiencing these signs and symptoms.  Patient advised to contact office with any questions or concerns.  Discussed that some blood pressure meds can affect lithium levels and cause him to go up.  Discussed the type of blood pressure meds she can use and there potential effects on lithium levels which can be monitored.  The most important type to avoid would be the thiazide diuretics.  We also discussed nonsteroidal anti-inflammatories and their risks with lithium. Rare lithium tremor resolved.   Disc lithium level, CBZ level, etc. She's nervous about the lower lithium dosage with fear of mania. Mania has not recurred with less lithium.   Consider increase CBZ to compensate for lower lithium dose if needed. Also low dose CBZ.  Stable mood. Carbatrol XR 400 AM and 600 PM Disc SE .  Vertigo resolved with current carbamazepine dosing.  Check lithium at one month after last level and then repeat every 3 mos March 03, 2020 lithium level 0.6 on 900 mg daily.  No med changes Last lithium level received 0.6 from May 29, 2020 still on 900 mg daily with no med changes. She agrees.  We have had problems with lab [facility] getting's the results.  Disc in detail risk factors for lithium toxicity: Gave her a copy and discussed the contents of  the article by Dr. Herbie Baltimore post published in 2018 on the underutilization of lithium.  Her primary care doctor has been critical of her being on lithium even though she has no adverse effects.  Educated her about the long-term benefits of lithium as opposed to other mood stabilizers and that in her case the only alternative would be atypical antipsychotics.  Discussed long-term side effect risks of those medications as well.  She elects to continue the lithium as it is my recommendation.  She wants to avoid the risk of manic psychosis that she experienced years ago.  FU 4 mos  Lynder Parents, MD, DFAPA  Please see After Visit Summary for patient specific instructions.  Future Appointments  Date Time Provider [place]  05/28/2021 10:00 AM Cottle, Billey Co., MD [facility] None    Orders Placed This Encounter  Procedures  . Basic metabolic panel  . Lithium level      -------------------------------

## 2020-11-26 DIAGNOSIS — M50321 Other cervical disc degeneration at C4-C5 level: Secondary | ICD-10-CM | POA: Diagnosis not present

## 2020-11-26 DIAGNOSIS — M5136 Other intervertebral disc degeneration, lumbar region: Secondary | ICD-10-CM | POA: Diagnosis not present

## 2020-11-26 DIAGNOSIS — M50322 Other cervical disc degeneration at C5-C6 level: Secondary | ICD-10-CM | POA: Diagnosis not present

## 2020-11-26 DIAGNOSIS — M9901 Segmental and somatic dysfunction of cervical region: Secondary | ICD-10-CM | POA: Diagnosis not present

## 2020-11-26 DIAGNOSIS — M9903 Segmental and somatic dysfunction of lumbar region: Secondary | ICD-10-CM | POA: Diagnosis not present

## 2020-11-26 DIAGNOSIS — M47814 Spondylosis without myelopathy or radiculopathy, thoracic region: Secondary | ICD-10-CM | POA: Diagnosis not present

## 2020-11-26 DIAGNOSIS — M5137 Other intervertebral disc degeneration, lumbosacral region: Secondary | ICD-10-CM | POA: Diagnosis not present

## 2020-11-26 DIAGNOSIS — M9904 Segmental and somatic dysfunction of sacral region: Secondary | ICD-10-CM | POA: Diagnosis not present

## 2020-11-26 DIAGNOSIS — Q72891 Other reduction defects of right lower limb: Secondary | ICD-10-CM | POA: Diagnosis not present

## 2020-11-26 DIAGNOSIS — M531 Cervicobrachial syndrome: Secondary | ICD-10-CM | POA: Diagnosis not present

## 2020-11-26 DIAGNOSIS — M9905 Segmental and somatic dysfunction of pelvic region: Secondary | ICD-10-CM | POA: Diagnosis not present

## 2020-11-26 DIAGNOSIS — M50323 Other cervical disc degeneration at C6-C7 level: Secondary | ICD-10-CM | POA: Diagnosis not present

## 2020-11-27 DIAGNOSIS — M5136 Other intervertebral disc degeneration, lumbar region: Secondary | ICD-10-CM | POA: Diagnosis not present

## 2020-11-27 DIAGNOSIS — M9901 Segmental and somatic dysfunction of cervical region: Secondary | ICD-10-CM | POA: Diagnosis not present

## 2020-11-27 DIAGNOSIS — M531 Cervicobrachial syndrome: Secondary | ICD-10-CM | POA: Diagnosis not present

## 2020-11-27 DIAGNOSIS — M47814 Spondylosis without myelopathy or radiculopathy, thoracic region: Secondary | ICD-10-CM | POA: Diagnosis not present

## 2020-11-27 DIAGNOSIS — M5137 Other intervertebral disc degeneration, lumbosacral region: Secondary | ICD-10-CM | POA: Diagnosis not present

## 2020-11-27 DIAGNOSIS — M50322 Other cervical disc degeneration at C5-C6 level: Secondary | ICD-10-CM | POA: Diagnosis not present

## 2020-11-27 DIAGNOSIS — M9904 Segmental and somatic dysfunction of sacral region: Secondary | ICD-10-CM | POA: Diagnosis not present

## 2020-11-27 DIAGNOSIS — M50323 Other cervical disc degeneration at C6-C7 level: Secondary | ICD-10-CM | POA: Diagnosis not present

## 2020-11-27 DIAGNOSIS — Q72891 Other reduction defects of right lower limb: Secondary | ICD-10-CM | POA: Diagnosis not present

## 2020-11-27 DIAGNOSIS — M9905 Segmental and somatic dysfunction of pelvic region: Secondary | ICD-10-CM | POA: Diagnosis not present

## 2020-11-27 DIAGNOSIS — M9903 Segmental and somatic dysfunction of lumbar region: Secondary | ICD-10-CM | POA: Diagnosis not present

## 2020-11-27 DIAGNOSIS — M50321 Other cervical disc degeneration at C4-C5 level: Secondary | ICD-10-CM | POA: Diagnosis not present

## 2020-12-04 DIAGNOSIS — M9905 Segmental and somatic dysfunction of pelvic region: Secondary | ICD-10-CM | POA: Diagnosis not present

## 2020-12-04 DIAGNOSIS — M5136 Other intervertebral disc degeneration, lumbar region: Secondary | ICD-10-CM | POA: Diagnosis not present

## 2020-12-04 DIAGNOSIS — M50323 Other cervical disc degeneration at C6-C7 level: Secondary | ICD-10-CM | POA: Diagnosis not present

## 2020-12-04 DIAGNOSIS — M9904 Segmental and somatic dysfunction of sacral region: Secondary | ICD-10-CM | POA: Diagnosis not present

## 2020-12-04 DIAGNOSIS — Q72891 Other reduction defects of right lower limb: Secondary | ICD-10-CM | POA: Diagnosis not present

## 2020-12-04 DIAGNOSIS — M9903 Segmental and somatic dysfunction of lumbar region: Secondary | ICD-10-CM | POA: Diagnosis not present

## 2020-12-04 DIAGNOSIS — M47814 Spondylosis without myelopathy or radiculopathy, thoracic region: Secondary | ICD-10-CM | POA: Diagnosis not present

## 2020-12-04 DIAGNOSIS — M9901 Segmental and somatic dysfunction of cervical region: Secondary | ICD-10-CM | POA: Diagnosis not present

## 2020-12-04 DIAGNOSIS — M50321 Other cervical disc degeneration at C4-C5 level: Secondary | ICD-10-CM | POA: Diagnosis not present

## 2020-12-04 DIAGNOSIS — M531 Cervicobrachial syndrome: Secondary | ICD-10-CM | POA: Diagnosis not present

## 2020-12-04 DIAGNOSIS — M50322 Other cervical disc degeneration at C5-C6 level: Secondary | ICD-10-CM | POA: Diagnosis not present

## 2020-12-04 DIAGNOSIS — M5137 Other intervertebral disc degeneration, lumbosacral region: Secondary | ICD-10-CM | POA: Diagnosis not present

## 2020-12-09 DIAGNOSIS — M5136 Other intervertebral disc degeneration, lumbar region: Secondary | ICD-10-CM | POA: Diagnosis not present

## 2020-12-09 DIAGNOSIS — M50322 Other cervical disc degeneration at C5-C6 level: Secondary | ICD-10-CM | POA: Diagnosis not present

## 2020-12-09 DIAGNOSIS — M9903 Segmental and somatic dysfunction of lumbar region: Secondary | ICD-10-CM | POA: Diagnosis not present

## 2020-12-09 DIAGNOSIS — M5137 Other intervertebral disc degeneration, lumbosacral region: Secondary | ICD-10-CM | POA: Diagnosis not present

## 2020-12-09 DIAGNOSIS — Q72891 Other reduction defects of right lower limb: Secondary | ICD-10-CM | POA: Diagnosis not present

## 2020-12-09 DIAGNOSIS — M47814 Spondylosis without myelopathy or radiculopathy, thoracic region: Secondary | ICD-10-CM | POA: Diagnosis not present

## 2020-12-09 DIAGNOSIS — M50323 Other cervical disc degeneration at C6-C7 level: Secondary | ICD-10-CM | POA: Diagnosis not present

## 2020-12-09 DIAGNOSIS — M50321 Other cervical disc degeneration at C4-C5 level: Secondary | ICD-10-CM | POA: Diagnosis not present

## 2020-12-09 DIAGNOSIS — M9905 Segmental and somatic dysfunction of pelvic region: Secondary | ICD-10-CM | POA: Diagnosis not present

## 2020-12-09 DIAGNOSIS — M9904 Segmental and somatic dysfunction of sacral region: Secondary | ICD-10-CM | POA: Diagnosis not present

## 2020-12-09 DIAGNOSIS — M531 Cervicobrachial syndrome: Secondary | ICD-10-CM | POA: Diagnosis not present

## 2020-12-09 DIAGNOSIS — M9901 Segmental and somatic dysfunction of cervical region: Secondary | ICD-10-CM | POA: Diagnosis not present

## 2020-12-11 DIAGNOSIS — M9905 Segmental and somatic dysfunction of pelvic region: Secondary | ICD-10-CM | POA: Diagnosis not present

## 2020-12-11 DIAGNOSIS — R69 Illness, unspecified: Secondary | ICD-10-CM | POA: Diagnosis not present

## 2020-12-11 DIAGNOSIS — M5137 Other intervertebral disc degeneration, lumbosacral region: Secondary | ICD-10-CM | POA: Diagnosis not present

## 2020-12-11 DIAGNOSIS — M9903 Segmental and somatic dysfunction of lumbar region: Secondary | ICD-10-CM | POA: Diagnosis not present

## 2020-12-11 DIAGNOSIS — M9904 Segmental and somatic dysfunction of sacral region: Secondary | ICD-10-CM | POA: Diagnosis not present

## 2020-12-11 DIAGNOSIS — M9901 Segmental and somatic dysfunction of cervical region: Secondary | ICD-10-CM | POA: Diagnosis not present

## 2020-12-11 DIAGNOSIS — M50322 Other cervical disc degeneration at C5-C6 level: Secondary | ICD-10-CM | POA: Diagnosis not present

## 2020-12-11 DIAGNOSIS — M5136 Other intervertebral disc degeneration, lumbar region: Secondary | ICD-10-CM | POA: Diagnosis not present

## 2020-12-11 DIAGNOSIS — G251 Drug-induced tremor: Secondary | ICD-10-CM | POA: Diagnosis not present

## 2020-12-11 DIAGNOSIS — Q72891 Other reduction defects of right lower limb: Secondary | ICD-10-CM | POA: Diagnosis not present

## 2020-12-11 DIAGNOSIS — M50321 Other cervical disc degeneration at C4-C5 level: Secondary | ICD-10-CM | POA: Diagnosis not present

## 2020-12-11 DIAGNOSIS — Z79899 Other long term (current) drug therapy: Secondary | ICD-10-CM | POA: Diagnosis not present

## 2020-12-11 DIAGNOSIS — M531 Cervicobrachial syndrome: Secondary | ICD-10-CM | POA: Diagnosis not present

## 2020-12-11 DIAGNOSIS — M50323 Other cervical disc degeneration at C6-C7 level: Secondary | ICD-10-CM | POA: Diagnosis not present

## 2020-12-11 DIAGNOSIS — M47814 Spondylosis without myelopathy or radiculopathy, thoracic region: Secondary | ICD-10-CM | POA: Diagnosis not present

## 2020-12-15 DIAGNOSIS — M9901 Segmental and somatic dysfunction of cervical region: Secondary | ICD-10-CM | POA: Diagnosis not present

## 2020-12-15 DIAGNOSIS — M50322 Other cervical disc degeneration at C5-C6 level: Secondary | ICD-10-CM | POA: Diagnosis not present

## 2020-12-15 DIAGNOSIS — M9903 Segmental and somatic dysfunction of lumbar region: Secondary | ICD-10-CM | POA: Diagnosis not present

## 2020-12-15 DIAGNOSIS — M5137 Other intervertebral disc degeneration, lumbosacral region: Secondary | ICD-10-CM | POA: Diagnosis not present

## 2020-12-15 DIAGNOSIS — M50321 Other cervical disc degeneration at C4-C5 level: Secondary | ICD-10-CM | POA: Diagnosis not present

## 2020-12-15 DIAGNOSIS — M531 Cervicobrachial syndrome: Secondary | ICD-10-CM | POA: Diagnosis not present

## 2020-12-15 DIAGNOSIS — M5136 Other intervertebral disc degeneration, lumbar region: Secondary | ICD-10-CM | POA: Diagnosis not present

## 2020-12-15 DIAGNOSIS — M50323 Other cervical disc degeneration at C6-C7 level: Secondary | ICD-10-CM | POA: Diagnosis not present

## 2020-12-15 DIAGNOSIS — M9904 Segmental and somatic dysfunction of sacral region: Secondary | ICD-10-CM | POA: Diagnosis not present

## 2020-12-15 DIAGNOSIS — M47814 Spondylosis without myelopathy or radiculopathy, thoracic region: Secondary | ICD-10-CM | POA: Diagnosis not present

## 2020-12-15 DIAGNOSIS — M9905 Segmental and somatic dysfunction of pelvic region: Secondary | ICD-10-CM | POA: Diagnosis not present

## 2020-12-15 DIAGNOSIS — Q72891 Other reduction defects of right lower limb: Secondary | ICD-10-CM | POA: Diagnosis not present

## 2020-12-16 ENCOUNTER — Encounter: Payer: Self-pay | Admitting: Psychiatry

## 2020-12-17 DIAGNOSIS — M50323 Other cervical disc degeneration at C6-C7 level: Secondary | ICD-10-CM | POA: Diagnosis not present

## 2020-12-17 DIAGNOSIS — Q72891 Other reduction defects of right lower limb: Secondary | ICD-10-CM | POA: Diagnosis not present

## 2020-12-17 DIAGNOSIS — M5137 Other intervertebral disc degeneration, lumbosacral region: Secondary | ICD-10-CM | POA: Diagnosis not present

## 2020-12-17 DIAGNOSIS — M50321 Other cervical disc degeneration at C4-C5 level: Secondary | ICD-10-CM | POA: Diagnosis not present

## 2020-12-17 DIAGNOSIS — M9901 Segmental and somatic dysfunction of cervical region: Secondary | ICD-10-CM | POA: Diagnosis not present

## 2020-12-17 DIAGNOSIS — M9903 Segmental and somatic dysfunction of lumbar region: Secondary | ICD-10-CM | POA: Diagnosis not present

## 2020-12-17 DIAGNOSIS — M50322 Other cervical disc degeneration at C5-C6 level: Secondary | ICD-10-CM | POA: Diagnosis not present

## 2020-12-17 DIAGNOSIS — M531 Cervicobrachial syndrome: Secondary | ICD-10-CM | POA: Diagnosis not present

## 2020-12-17 DIAGNOSIS — M5136 Other intervertebral disc degeneration, lumbar region: Secondary | ICD-10-CM | POA: Diagnosis not present

## 2020-12-17 DIAGNOSIS — M9904 Segmental and somatic dysfunction of sacral region: Secondary | ICD-10-CM | POA: Diagnosis not present

## 2020-12-17 DIAGNOSIS — M9905 Segmental and somatic dysfunction of pelvic region: Secondary | ICD-10-CM | POA: Diagnosis not present

## 2020-12-17 DIAGNOSIS — M47814 Spondylosis without myelopathy or radiculopathy, thoracic region: Secondary | ICD-10-CM | POA: Diagnosis not present

## 2020-12-17 NOTE — Progress Notes (Signed)
Let patient know her lithium level is normal and stable at 0.8.  Also normal kidney function and calcium. 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 No med changes indicated.

## 2020-12-18 ENCOUNTER — Telehealth: Payer: Self-pay

## 2020-12-18 DIAGNOSIS — M531 Cervicobrachial syndrome: Secondary | ICD-10-CM | POA: Diagnosis not present

## 2020-12-18 DIAGNOSIS — M9903 Segmental and somatic dysfunction of lumbar region: Secondary | ICD-10-CM | POA: Diagnosis not present

## 2020-12-18 DIAGNOSIS — M9905 Segmental and somatic dysfunction of pelvic region: Secondary | ICD-10-CM | POA: Diagnosis not present

## 2020-12-18 DIAGNOSIS — M50322 Other cervical disc degeneration at C5-C6 level: Secondary | ICD-10-CM | POA: Diagnosis not present

## 2020-12-18 DIAGNOSIS — M5137 Other intervertebral disc degeneration, lumbosacral region: Secondary | ICD-10-CM | POA: Diagnosis not present

## 2020-12-18 DIAGNOSIS — M9901 Segmental and somatic dysfunction of cervical region: Secondary | ICD-10-CM | POA: Diagnosis not present

## 2020-12-18 DIAGNOSIS — M50321 Other cervical disc degeneration at C4-C5 level: Secondary | ICD-10-CM | POA: Diagnosis not present

## 2020-12-18 DIAGNOSIS — M47814 Spondylosis without myelopathy or radiculopathy, thoracic region: Secondary | ICD-10-CM | POA: Diagnosis not present

## 2020-12-18 DIAGNOSIS — Q72891 Other reduction defects of right lower limb: Secondary | ICD-10-CM | POA: Diagnosis not present

## 2020-12-18 DIAGNOSIS — M9904 Segmental and somatic dysfunction of sacral region: Secondary | ICD-10-CM | POA: Diagnosis not present

## 2020-12-18 DIAGNOSIS — M50323 Other cervical disc degeneration at C6-C7 level: Secondary | ICD-10-CM | POA: Diagnosis not present

## 2020-12-18 DIAGNOSIS — M5136 Other intervertebral disc degeneration, lumbar region: Secondary | ICD-10-CM | POA: Diagnosis not present

## 2020-12-18 NOTE — Progress Notes (Signed)
Pt has been called and made aware

## 2020-12-23 DIAGNOSIS — M9904 Segmental and somatic dysfunction of sacral region: Secondary | ICD-10-CM | POA: Diagnosis not present

## 2020-12-23 DIAGNOSIS — M9903 Segmental and somatic dysfunction of lumbar region: Secondary | ICD-10-CM | POA: Diagnosis not present

## 2020-12-23 DIAGNOSIS — M531 Cervicobrachial syndrome: Secondary | ICD-10-CM | POA: Diagnosis not present

## 2020-12-23 DIAGNOSIS — M50321 Other cervical disc degeneration at C4-C5 level: Secondary | ICD-10-CM | POA: Diagnosis not present

## 2020-12-23 DIAGNOSIS — M47814 Spondylosis without myelopathy or radiculopathy, thoracic region: Secondary | ICD-10-CM | POA: Diagnosis not present

## 2020-12-23 DIAGNOSIS — M5137 Other intervertebral disc degeneration, lumbosacral region: Secondary | ICD-10-CM | POA: Diagnosis not present

## 2020-12-23 DIAGNOSIS — M9905 Segmental and somatic dysfunction of pelvic region: Secondary | ICD-10-CM | POA: Diagnosis not present

## 2020-12-23 DIAGNOSIS — Q72891 Other reduction defects of right lower limb: Secondary | ICD-10-CM | POA: Diagnosis not present

## 2020-12-23 DIAGNOSIS — M50323 Other cervical disc degeneration at C6-C7 level: Secondary | ICD-10-CM | POA: Diagnosis not present

## 2020-12-23 DIAGNOSIS — M5136 Other intervertebral disc degeneration, lumbar region: Secondary | ICD-10-CM | POA: Diagnosis not present

## 2020-12-23 DIAGNOSIS — M50322 Other cervical disc degeneration at C5-C6 level: Secondary | ICD-10-CM | POA: Diagnosis not present

## 2020-12-23 DIAGNOSIS — M9901 Segmental and somatic dysfunction of cervical region: Secondary | ICD-10-CM | POA: Diagnosis not present

## 2020-12-25 DIAGNOSIS — M9903 Segmental and somatic dysfunction of lumbar region: Secondary | ICD-10-CM | POA: Diagnosis not present

## 2020-12-25 DIAGNOSIS — M531 Cervicobrachial syndrome: Secondary | ICD-10-CM | POA: Diagnosis not present

## 2020-12-25 DIAGNOSIS — M5137 Other intervertebral disc degeneration, lumbosacral region: Secondary | ICD-10-CM | POA: Diagnosis not present

## 2020-12-25 DIAGNOSIS — M9904 Segmental and somatic dysfunction of sacral region: Secondary | ICD-10-CM | POA: Diagnosis not present

## 2020-12-25 DIAGNOSIS — M5136 Other intervertebral disc degeneration, lumbar region: Secondary | ICD-10-CM | POA: Diagnosis not present

## 2020-12-25 DIAGNOSIS — M50323 Other cervical disc degeneration at C6-C7 level: Secondary | ICD-10-CM | POA: Diagnosis not present

## 2020-12-25 DIAGNOSIS — M47814 Spondylosis without myelopathy or radiculopathy, thoracic region: Secondary | ICD-10-CM | POA: Diagnosis not present

## 2020-12-25 DIAGNOSIS — M50321 Other cervical disc degeneration at C4-C5 level: Secondary | ICD-10-CM | POA: Diagnosis not present

## 2020-12-25 DIAGNOSIS — M50322 Other cervical disc degeneration at C5-C6 level: Secondary | ICD-10-CM | POA: Diagnosis not present

## 2020-12-25 DIAGNOSIS — M9905 Segmental and somatic dysfunction of pelvic region: Secondary | ICD-10-CM | POA: Diagnosis not present

## 2020-12-25 DIAGNOSIS — Q72891 Other reduction defects of right lower limb: Secondary | ICD-10-CM | POA: Diagnosis not present

## 2020-12-25 DIAGNOSIS — M9901 Segmental and somatic dysfunction of cervical region: Secondary | ICD-10-CM | POA: Diagnosis not present

## 2020-12-30 DIAGNOSIS — M50321 Other cervical disc degeneration at C4-C5 level: Secondary | ICD-10-CM | POA: Diagnosis not present

## 2020-12-30 DIAGNOSIS — M5137 Other intervertebral disc degeneration, lumbosacral region: Secondary | ICD-10-CM | POA: Diagnosis not present

## 2020-12-30 DIAGNOSIS — Q72891 Other reduction defects of right lower limb: Secondary | ICD-10-CM | POA: Diagnosis not present

## 2020-12-30 DIAGNOSIS — M50322 Other cervical disc degeneration at C5-C6 level: Secondary | ICD-10-CM | POA: Diagnosis not present

## 2020-12-30 DIAGNOSIS — M9901 Segmental and somatic dysfunction of cervical region: Secondary | ICD-10-CM | POA: Diagnosis not present

## 2020-12-30 DIAGNOSIS — M47814 Spondylosis without myelopathy or radiculopathy, thoracic region: Secondary | ICD-10-CM | POA: Diagnosis not present

## 2020-12-30 DIAGNOSIS — M50323 Other cervical disc degeneration at C6-C7 level: Secondary | ICD-10-CM | POA: Diagnosis not present

## 2020-12-30 DIAGNOSIS — M531 Cervicobrachial syndrome: Secondary | ICD-10-CM | POA: Diagnosis not present

## 2020-12-30 DIAGNOSIS — M9903 Segmental and somatic dysfunction of lumbar region: Secondary | ICD-10-CM | POA: Diagnosis not present

## 2020-12-30 DIAGNOSIS — M5136 Other intervertebral disc degeneration, lumbar region: Secondary | ICD-10-CM | POA: Diagnosis not present

## 2020-12-30 DIAGNOSIS — M9904 Segmental and somatic dysfunction of sacral region: Secondary | ICD-10-CM | POA: Diagnosis not present

## 2020-12-30 DIAGNOSIS — M9905 Segmental and somatic dysfunction of pelvic region: Secondary | ICD-10-CM | POA: Diagnosis not present

## 2020-12-31 ENCOUNTER — Other Ambulatory Visit: Payer: Self-pay

## 2020-12-31 DIAGNOSIS — F319 Bipolar disorder, unspecified: Secondary | ICD-10-CM

## 2020-12-31 MED ORDER — LITHIUM CARBONATE ER 300 MG PO TBCR: 900.0000 mg | EXTENDED_RELEASE_TABLET | Freq: Every day | ORAL | 1 refills | Status: DC

## 2020-12-31 MED ORDER — CARBAMAZEPINE ER 200 MG PO CP12: ORAL_CAPSULE | ORAL | 1 refills | Status: DC

## 2021-01-01 DIAGNOSIS — M9905 Segmental and somatic dysfunction of pelvic region: Secondary | ICD-10-CM | POA: Diagnosis not present

## 2021-01-01 DIAGNOSIS — M9903 Segmental and somatic dysfunction of lumbar region: Secondary | ICD-10-CM | POA: Diagnosis not present

## 2021-01-01 DIAGNOSIS — M47814 Spondylosis without myelopathy or radiculopathy, thoracic region: Secondary | ICD-10-CM | POA: Diagnosis not present

## 2021-01-01 DIAGNOSIS — M9904 Segmental and somatic dysfunction of sacral region: Secondary | ICD-10-CM | POA: Diagnosis not present

## 2021-01-01 DIAGNOSIS — Q72891 Other reduction defects of right lower limb: Secondary | ICD-10-CM | POA: Diagnosis not present

## 2021-01-01 DIAGNOSIS — M5137 Other intervertebral disc degeneration, lumbosacral region: Secondary | ICD-10-CM | POA: Diagnosis not present

## 2021-01-01 DIAGNOSIS — M50321 Other cervical disc degeneration at C4-C5 level: Secondary | ICD-10-CM | POA: Diagnosis not present

## 2021-01-01 DIAGNOSIS — M50323 Other cervical disc degeneration at C6-C7 level: Secondary | ICD-10-CM | POA: Diagnosis not present

## 2021-01-01 DIAGNOSIS — M531 Cervicobrachial syndrome: Secondary | ICD-10-CM | POA: Diagnosis not present

## 2021-01-01 DIAGNOSIS — M50322 Other cervical disc degeneration at C5-C6 level: Secondary | ICD-10-CM | POA: Diagnosis not present

## 2021-01-01 DIAGNOSIS — M5136 Other intervertebral disc degeneration, lumbar region: Secondary | ICD-10-CM | POA: Diagnosis not present

## 2021-01-01 DIAGNOSIS — M9901 Segmental and somatic dysfunction of cervical region: Secondary | ICD-10-CM | POA: Diagnosis not present

## 2021-01-06 DIAGNOSIS — M47814 Spondylosis without myelopathy or radiculopathy, thoracic region: Secondary | ICD-10-CM | POA: Diagnosis not present

## 2021-01-06 DIAGNOSIS — M5136 Other intervertebral disc degeneration, lumbar region: Secondary | ICD-10-CM | POA: Diagnosis not present

## 2021-01-06 DIAGNOSIS — Q72891 Other reduction defects of right lower limb: Secondary | ICD-10-CM | POA: Diagnosis not present

## 2021-01-06 DIAGNOSIS — M50322 Other cervical disc degeneration at C5-C6 level: Secondary | ICD-10-CM | POA: Diagnosis not present

## 2021-01-06 DIAGNOSIS — M9901 Segmental and somatic dysfunction of cervical region: Secondary | ICD-10-CM | POA: Diagnosis not present

## 2021-01-06 DIAGNOSIS — M9904 Segmental and somatic dysfunction of sacral region: Secondary | ICD-10-CM | POA: Diagnosis not present

## 2021-01-06 DIAGNOSIS — M50323 Other cervical disc degeneration at C6-C7 level: Secondary | ICD-10-CM | POA: Diagnosis not present

## 2021-01-06 DIAGNOSIS — M5137 Other intervertebral disc degeneration, lumbosacral region: Secondary | ICD-10-CM | POA: Diagnosis not present

## 2021-01-06 DIAGNOSIS — M50321 Other cervical disc degeneration at C4-C5 level: Secondary | ICD-10-CM | POA: Diagnosis not present

## 2021-01-06 DIAGNOSIS — M9903 Segmental and somatic dysfunction of lumbar region: Secondary | ICD-10-CM | POA: Diagnosis not present

## 2021-01-06 DIAGNOSIS — M9905 Segmental and somatic dysfunction of pelvic region: Secondary | ICD-10-CM | POA: Diagnosis not present

## 2021-01-06 DIAGNOSIS — M531 Cervicobrachial syndrome: Secondary | ICD-10-CM | POA: Diagnosis not present

## 2021-01-08 DIAGNOSIS — R69 Illness, unspecified: Secondary | ICD-10-CM | POA: Diagnosis not present

## 2021-01-08 DIAGNOSIS — N926 Irregular menstruation, unspecified: Secondary | ICD-10-CM | POA: Diagnosis not present

## 2021-01-08 DIAGNOSIS — E782 Mixed hyperlipidemia: Secondary | ICD-10-CM | POA: Diagnosis not present

## 2021-01-08 DIAGNOSIS — M531 Cervicobrachial syndrome: Secondary | ICD-10-CM | POA: Diagnosis not present

## 2021-01-08 DIAGNOSIS — I10 Essential (primary) hypertension: Secondary | ICD-10-CM | POA: Diagnosis not present

## 2021-01-08 DIAGNOSIS — Z0001 Encounter for general adult medical examination with abnormal findings: Secondary | ICD-10-CM | POA: Diagnosis not present

## 2021-01-08 DIAGNOSIS — M2141 Flat foot [pes planus] (acquired), right foot: Secondary | ICD-10-CM | POA: Diagnosis not present

## 2021-01-08 DIAGNOSIS — J3089 Other allergic rhinitis: Secondary | ICD-10-CM | POA: Diagnosis not present

## 2021-01-08 DIAGNOSIS — M50321 Other cervical disc degeneration at C4-C5 level: Secondary | ICD-10-CM | POA: Diagnosis not present

## 2021-01-08 DIAGNOSIS — Q72891 Other reduction defects of right lower limb: Secondary | ICD-10-CM | POA: Diagnosis not present

## 2021-01-08 DIAGNOSIS — M9905 Segmental and somatic dysfunction of pelvic region: Secondary | ICD-10-CM | POA: Diagnosis not present

## 2021-01-08 DIAGNOSIS — M5136 Other intervertebral disc degeneration, lumbar region: Secondary | ICD-10-CM | POA: Diagnosis not present

## 2021-01-08 DIAGNOSIS — M79672 Pain in left foot: Secondary | ICD-10-CM | POA: Diagnosis not present

## 2021-01-08 DIAGNOSIS — M2142 Flat foot [pes planus] (acquired), left foot: Secondary | ICD-10-CM | POA: Diagnosis not present

## 2021-01-08 DIAGNOSIS — M9904 Segmental and somatic dysfunction of sacral region: Secondary | ICD-10-CM | POA: Diagnosis not present

## 2021-01-08 DIAGNOSIS — Z124 Encounter for screening for malignant neoplasm of cervix: Secondary | ICD-10-CM | POA: Diagnosis not present

## 2021-01-08 DIAGNOSIS — M50323 Other cervical disc degeneration at C6-C7 level: Secondary | ICD-10-CM | POA: Diagnosis not present

## 2021-01-08 DIAGNOSIS — M50322 Other cervical disc degeneration at C5-C6 level: Secondary | ICD-10-CM | POA: Diagnosis not present

## 2021-01-08 DIAGNOSIS — Z0000 Encounter for general adult medical examination without abnormal findings: Secondary | ICD-10-CM | POA: Diagnosis not present

## 2021-01-08 DIAGNOSIS — M79671 Pain in right foot: Secondary | ICD-10-CM | POA: Diagnosis not present

## 2021-01-08 DIAGNOSIS — M47814 Spondylosis without myelopathy or radiculopathy, thoracic region: Secondary | ICD-10-CM | POA: Diagnosis not present

## 2021-01-08 DIAGNOSIS — M5137 Other intervertebral disc degeneration, lumbosacral region: Secondary | ICD-10-CM | POA: Diagnosis not present

## 2021-01-08 DIAGNOSIS — R739 Hyperglycemia, unspecified: Secondary | ICD-10-CM | POA: Diagnosis not present

## 2021-01-08 DIAGNOSIS — M9903 Segmental and somatic dysfunction of lumbar region: Secondary | ICD-10-CM | POA: Diagnosis not present

## 2021-01-08 DIAGNOSIS — M9901 Segmental and somatic dysfunction of cervical region: Secondary | ICD-10-CM | POA: Diagnosis not present

## 2021-01-09 DIAGNOSIS — E782 Mixed hyperlipidemia: Secondary | ICD-10-CM | POA: Diagnosis not present

## 2021-01-09 DIAGNOSIS — M79671 Pain in right foot: Secondary | ICD-10-CM | POA: Diagnosis not present

## 2021-01-09 DIAGNOSIS — R69 Illness, unspecified: Secondary | ICD-10-CM | POA: Diagnosis not present

## 2021-01-09 DIAGNOSIS — M79672 Pain in left foot: Secondary | ICD-10-CM | POA: Diagnosis not present

## 2021-01-09 DIAGNOSIS — R739 Hyperglycemia, unspecified: Secondary | ICD-10-CM | POA: Diagnosis not present

## 2021-01-09 DIAGNOSIS — Z8349 Family history of other endocrine, nutritional and metabolic diseases: Secondary | ICD-10-CM | POA: Diagnosis not present

## 2021-01-09 DIAGNOSIS — Z131 Encounter for screening for diabetes mellitus: Secondary | ICD-10-CM | POA: Diagnosis not present

## 2021-01-09 DIAGNOSIS — Z0000 Encounter for general adult medical examination without abnormal findings: Secondary | ICD-10-CM | POA: Diagnosis not present

## 2021-01-09 DIAGNOSIS — J3089 Other allergic rhinitis: Secondary | ICD-10-CM | POA: Diagnosis not present

## 2021-01-09 DIAGNOSIS — N926 Irregular menstruation, unspecified: Secondary | ICD-10-CM | POA: Diagnosis not present

## 2021-01-09 DIAGNOSIS — I10 Essential (primary) hypertension: Secondary | ICD-10-CM | POA: Diagnosis not present

## 2021-01-09 DIAGNOSIS — Z8249 Family history of ischemic heart disease and other diseases of the circulatory system: Secondary | ICD-10-CM | POA: Diagnosis not present

## 2021-01-13 DIAGNOSIS — M5137 Other intervertebral disc degeneration, lumbosacral region: Secondary | ICD-10-CM | POA: Diagnosis not present

## 2021-01-13 DIAGNOSIS — M9901 Segmental and somatic dysfunction of cervical region: Secondary | ICD-10-CM | POA: Diagnosis not present

## 2021-01-13 DIAGNOSIS — M50323 Other cervical disc degeneration at C6-C7 level: Secondary | ICD-10-CM | POA: Diagnosis not present

## 2021-01-13 DIAGNOSIS — M50321 Other cervical disc degeneration at C4-C5 level: Secondary | ICD-10-CM | POA: Diagnosis not present

## 2021-01-13 DIAGNOSIS — M50322 Other cervical disc degeneration at C5-C6 level: Secondary | ICD-10-CM | POA: Diagnosis not present

## 2021-01-13 DIAGNOSIS — M9904 Segmental and somatic dysfunction of sacral region: Secondary | ICD-10-CM | POA: Diagnosis not present

## 2021-01-13 DIAGNOSIS — M531 Cervicobrachial syndrome: Secondary | ICD-10-CM | POA: Diagnosis not present

## 2021-01-13 DIAGNOSIS — Q72891 Other reduction defects of right lower limb: Secondary | ICD-10-CM | POA: Diagnosis not present

## 2021-01-13 DIAGNOSIS — M5136 Other intervertebral disc degeneration, lumbar region: Secondary | ICD-10-CM | POA: Diagnosis not present

## 2021-01-13 DIAGNOSIS — M9905 Segmental and somatic dysfunction of pelvic region: Secondary | ICD-10-CM | POA: Diagnosis not present

## 2021-01-13 DIAGNOSIS — M9903 Segmental and somatic dysfunction of lumbar region: Secondary | ICD-10-CM | POA: Diagnosis not present

## 2021-01-13 DIAGNOSIS — M47814 Spondylosis without myelopathy or radiculopathy, thoracic region: Secondary | ICD-10-CM | POA: Diagnosis not present

## 2021-01-21 DIAGNOSIS — M9905 Segmental and somatic dysfunction of pelvic region: Secondary | ICD-10-CM | POA: Diagnosis not present

## 2021-01-21 DIAGNOSIS — M5137 Other intervertebral disc degeneration, lumbosacral region: Secondary | ICD-10-CM | POA: Diagnosis not present

## 2021-01-21 DIAGNOSIS — M9901 Segmental and somatic dysfunction of cervical region: Secondary | ICD-10-CM | POA: Diagnosis not present

## 2021-01-21 DIAGNOSIS — M531 Cervicobrachial syndrome: Secondary | ICD-10-CM | POA: Diagnosis not present

## 2021-01-21 DIAGNOSIS — M9904 Segmental and somatic dysfunction of sacral region: Secondary | ICD-10-CM | POA: Diagnosis not present

## 2021-01-21 DIAGNOSIS — M50321 Other cervical disc degeneration at C4-C5 level: Secondary | ICD-10-CM | POA: Diagnosis not present

## 2021-01-21 DIAGNOSIS — M50323 Other cervical disc degeneration at C6-C7 level: Secondary | ICD-10-CM | POA: Diagnosis not present

## 2021-01-21 DIAGNOSIS — M9903 Segmental and somatic dysfunction of lumbar region: Secondary | ICD-10-CM | POA: Diagnosis not present

## 2021-01-21 DIAGNOSIS — M50322 Other cervical disc degeneration at C5-C6 level: Secondary | ICD-10-CM | POA: Diagnosis not present

## 2021-01-21 DIAGNOSIS — M47814 Spondylosis without myelopathy or radiculopathy, thoracic region: Secondary | ICD-10-CM | POA: Diagnosis not present

## 2021-01-21 DIAGNOSIS — Q72891 Other reduction defects of right lower limb: Secondary | ICD-10-CM | POA: Diagnosis not present

## 2021-01-21 DIAGNOSIS — M5136 Other intervertebral disc degeneration, lumbar region: Secondary | ICD-10-CM | POA: Diagnosis not present

## 2021-01-22 DIAGNOSIS — M79672 Pain in left foot: Secondary | ICD-10-CM | POA: Diagnosis not present

## 2021-01-22 DIAGNOSIS — M79671 Pain in right foot: Secondary | ICD-10-CM | POA: Diagnosis not present

## 2021-01-22 DIAGNOSIS — M2022 Hallux rigidus, left foot: Secondary | ICD-10-CM | POA: Diagnosis not present

## 2021-01-22 DIAGNOSIS — M2021 Hallux rigidus, right foot: Secondary | ICD-10-CM | POA: Diagnosis not present

## 2021-01-28 DIAGNOSIS — M531 Cervicobrachial syndrome: Secondary | ICD-10-CM | POA: Diagnosis not present

## 2021-01-28 DIAGNOSIS — M50322 Other cervical disc degeneration at C5-C6 level: Secondary | ICD-10-CM | POA: Diagnosis not present

## 2021-01-28 DIAGNOSIS — M50323 Other cervical disc degeneration at C6-C7 level: Secondary | ICD-10-CM | POA: Diagnosis not present

## 2021-01-28 DIAGNOSIS — Q72891 Other reduction defects of right lower limb: Secondary | ICD-10-CM | POA: Diagnosis not present

## 2021-01-28 DIAGNOSIS — M5137 Other intervertebral disc degeneration, lumbosacral region: Secondary | ICD-10-CM | POA: Diagnosis not present

## 2021-01-28 DIAGNOSIS — M47814 Spondylosis without myelopathy or radiculopathy, thoracic region: Secondary | ICD-10-CM | POA: Diagnosis not present

## 2021-01-28 DIAGNOSIS — M9904 Segmental and somatic dysfunction of sacral region: Secondary | ICD-10-CM | POA: Diagnosis not present

## 2021-01-28 DIAGNOSIS — M9903 Segmental and somatic dysfunction of lumbar region: Secondary | ICD-10-CM | POA: Diagnosis not present

## 2021-01-28 DIAGNOSIS — M50321 Other cervical disc degeneration at C4-C5 level: Secondary | ICD-10-CM | POA: Diagnosis not present

## 2021-01-28 DIAGNOSIS — M9905 Segmental and somatic dysfunction of pelvic region: Secondary | ICD-10-CM | POA: Diagnosis not present

## 2021-01-28 DIAGNOSIS — M9901 Segmental and somatic dysfunction of cervical region: Secondary | ICD-10-CM | POA: Diagnosis not present

## 2021-01-28 DIAGNOSIS — M5136 Other intervertebral disc degeneration, lumbar region: Secondary | ICD-10-CM | POA: Diagnosis not present

## 2021-02-05 DIAGNOSIS — L918 Other hypertrophic disorders of the skin: Secondary | ICD-10-CM | POA: Diagnosis not present

## 2021-02-14 IMAGING — CR DG CHEST 2V
2 series · 2 of 2 positions shown · non-contrast
Comparison: None.

CLINICAL DATA: Dizziness, shaking.

EXAM:
CHEST - 2 VIEW

[w chest pa]
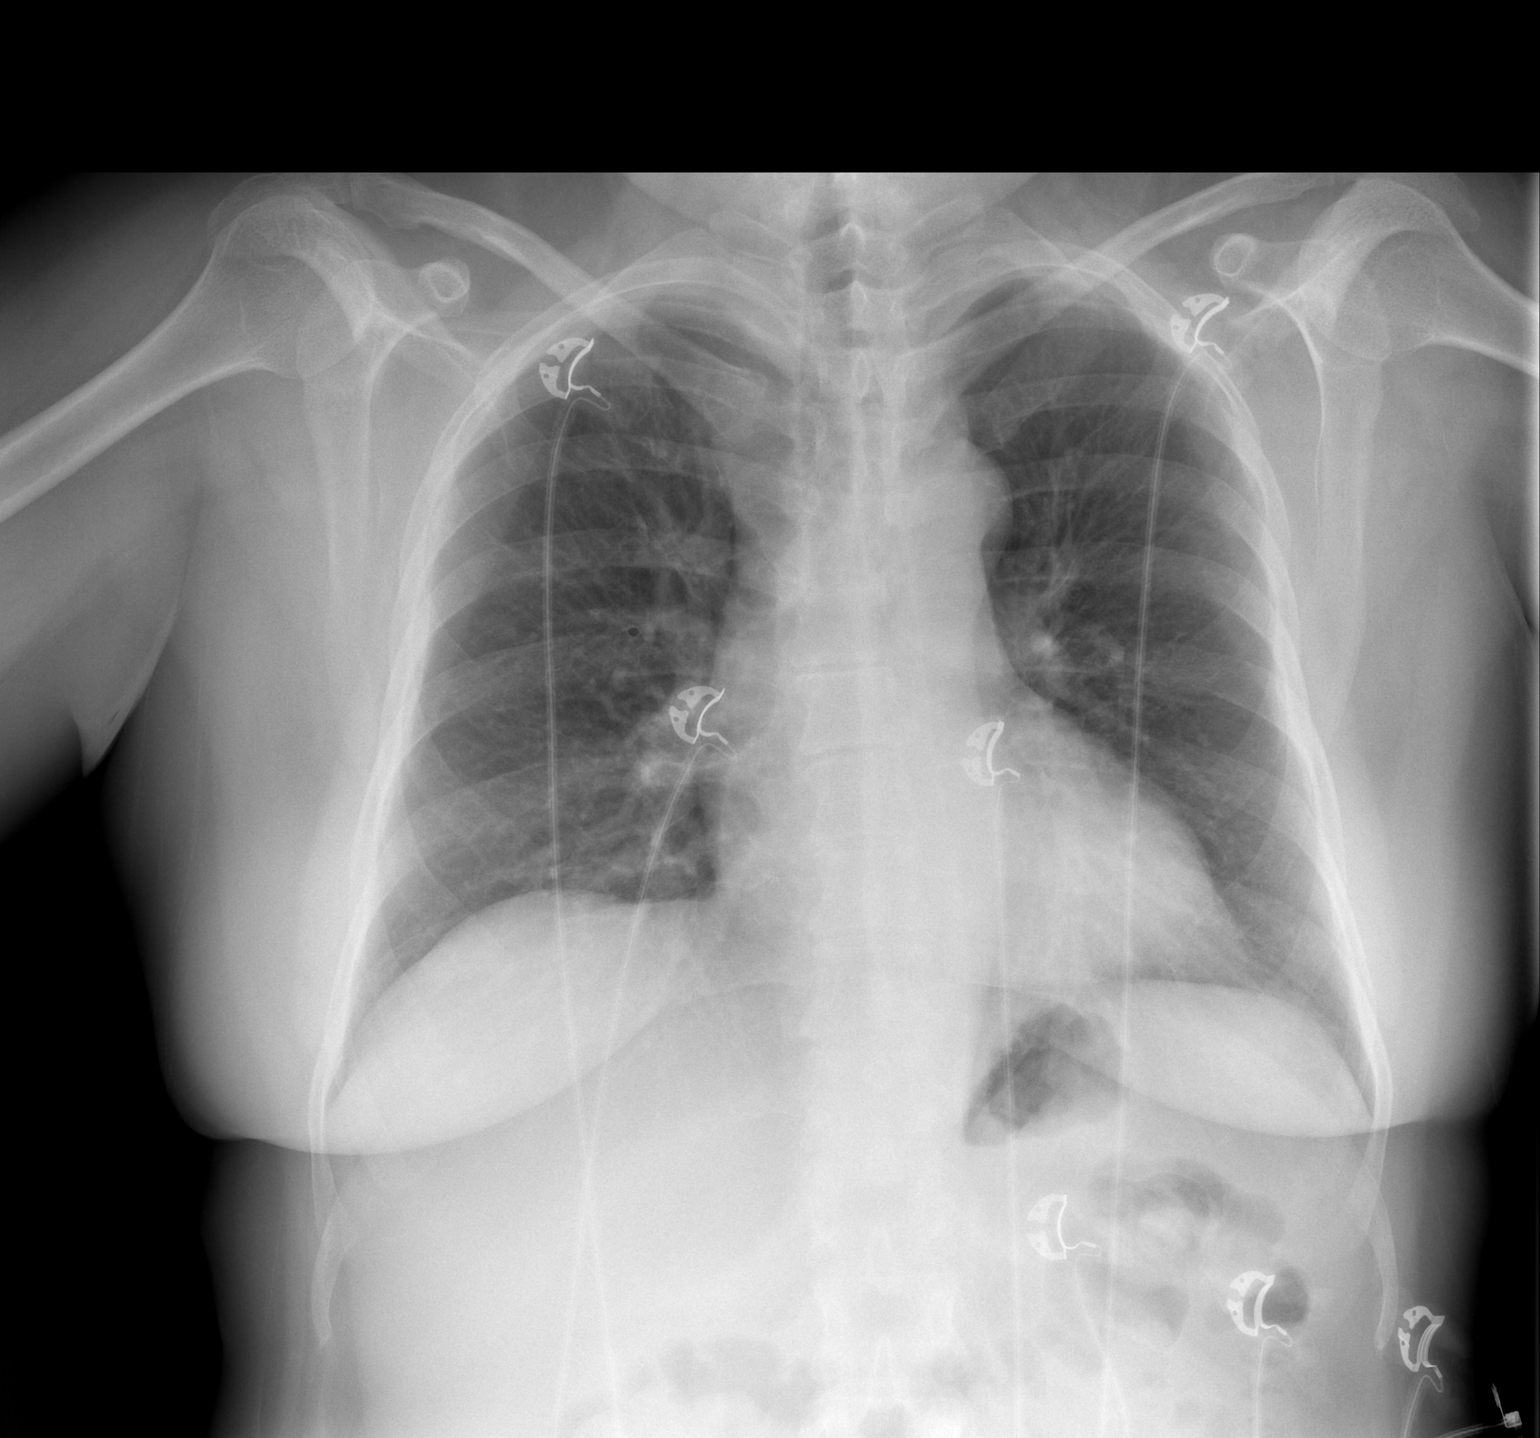

[w chest lat]
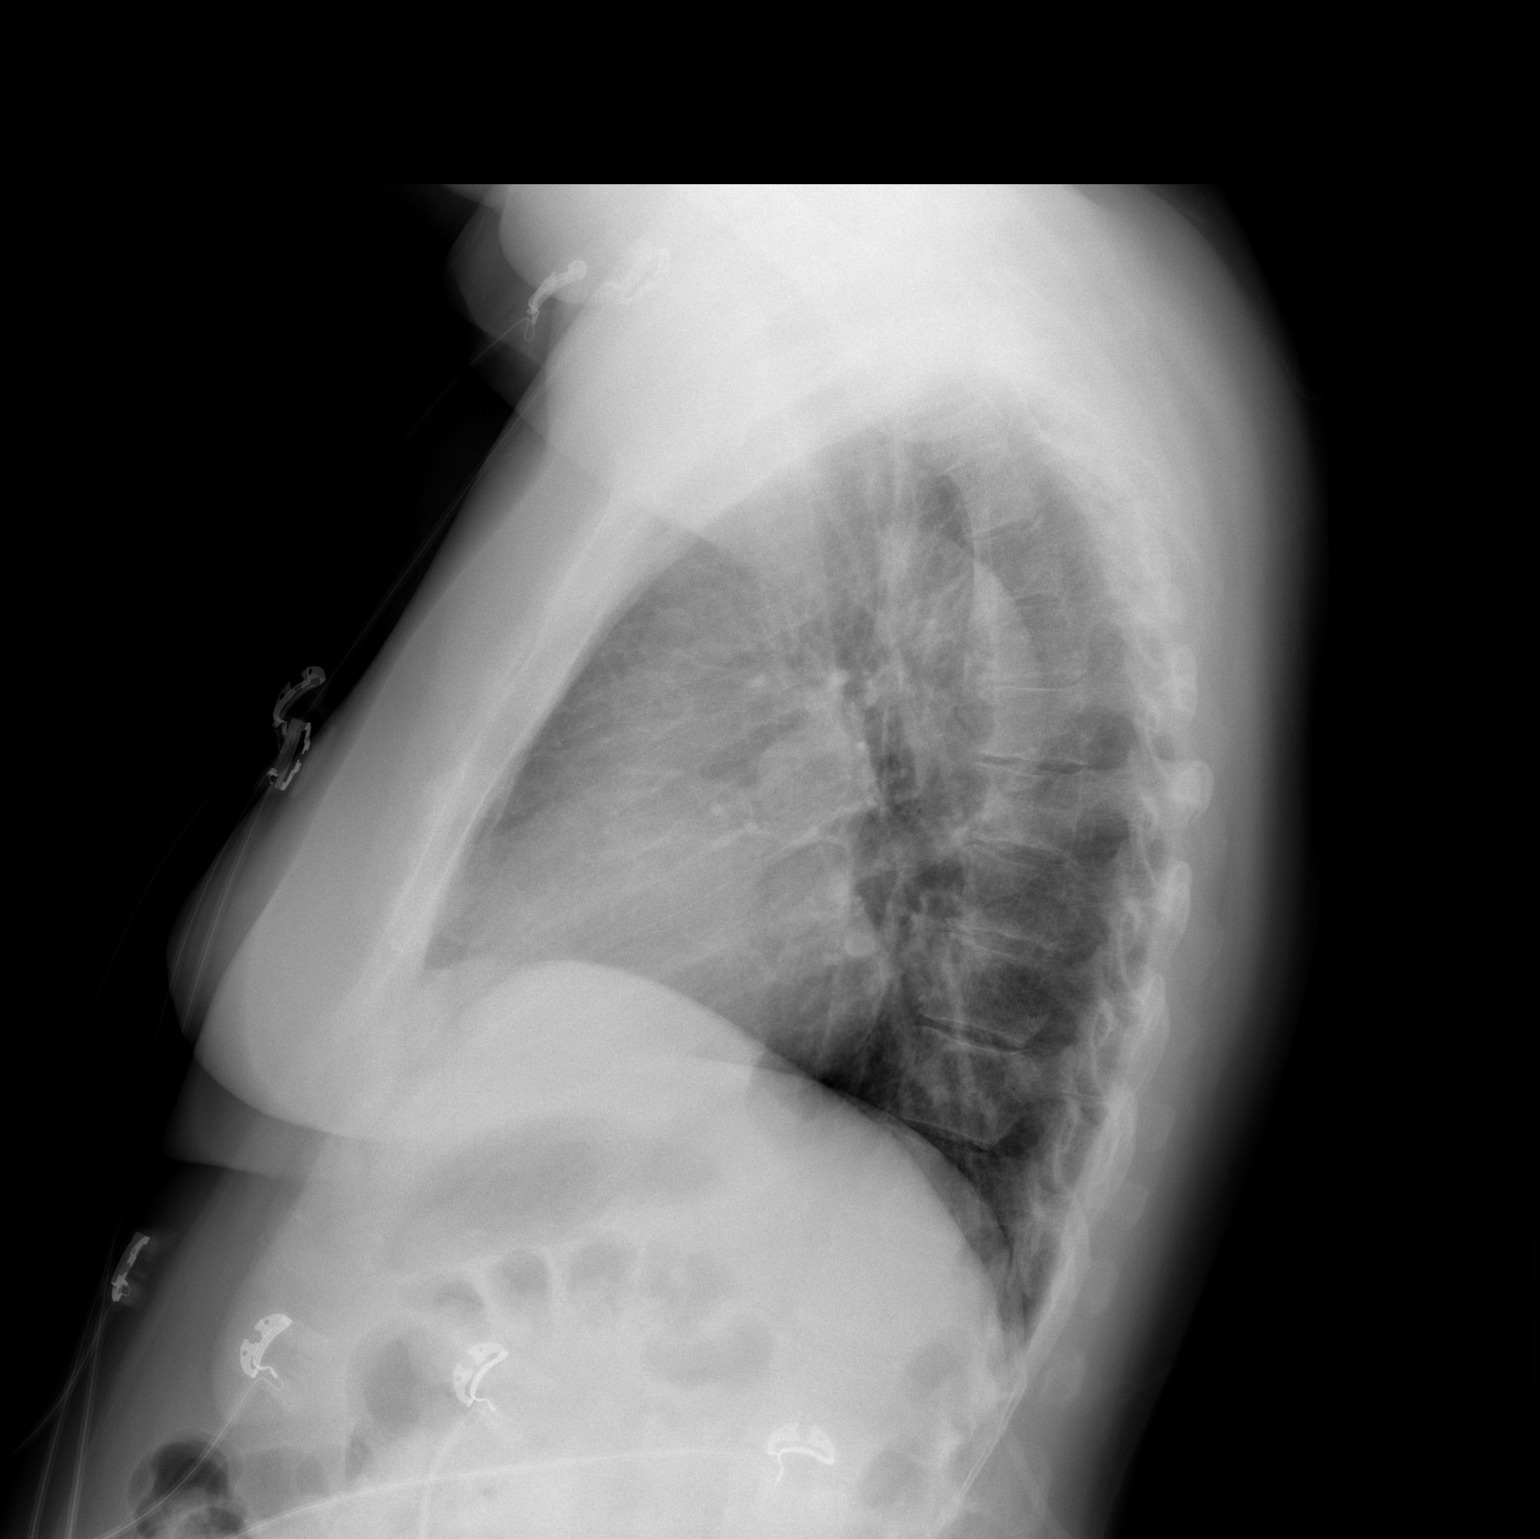

[2 of 2 positions shown; findings below may reference images not displayed]

FINDINGS: The heart size and mediastinal contours are within normal limits.
Both lungs are clear. The visualized skeletal structures are
unremarkable.
IMPRESSION: No active cardiopulmonary disease.  No evidence of pneumonia.

## 2021-03-18 ENCOUNTER — Other Ambulatory Visit: Payer: Self-pay | Admitting: Psychiatry

## 2021-03-18 ENCOUNTER — Telehealth: Payer: Self-pay | Admitting: Psychiatry

## 2021-03-18 DIAGNOSIS — F319 Bipolar disorder, unspecified: Secondary | ICD-10-CM

## 2021-03-18 NOTE — Telephone Encounter (Signed)
sent 

## 2021-03-18 NOTE — Telephone Encounter (Signed)
Please send lab order

## 2021-03-18 NOTE — Telephone Encounter (Signed)
Pt called and needs an order for a lithuim level sent to [facility]. She is going tomorrow to do the test.

## 2021-03-19 DIAGNOSIS — R69 Illness, unspecified: Secondary | ICD-10-CM | POA: Diagnosis not present

## 2021-03-20 LAB — LITHIUM LEVEL: Lithium Lvl: 0.7 mmol/L (ref 0.5–1.2)

## 2021-04-16 DIAGNOSIS — Z1239 Encounter for other screening for malignant neoplasm of breast: Secondary | ICD-10-CM | POA: Diagnosis not present

## 2021-04-16 DIAGNOSIS — Z1231 Encounter for screening mammogram for malignant neoplasm of breast: Secondary | ICD-10-CM | POA: Diagnosis not present

## 2021-04-23 DIAGNOSIS — R922 Inconclusive mammogram: Secondary | ICD-10-CM | POA: Diagnosis not present

## 2021-04-23 DIAGNOSIS — R928 Other abnormal and inconclusive findings on diagnostic imaging of breast: Secondary | ICD-10-CM | POA: Diagnosis not present

## 2021-05-28 ENCOUNTER — Encounter: Payer: Self-pay | Admitting: Psychiatry

## 2021-05-28 ENCOUNTER — Telehealth (INDEPENDENT_AMBULATORY_CARE_PROVIDER_SITE_OTHER): Payer: 59 | Admitting: Psychiatry

## 2021-05-28 DIAGNOSIS — G251 Drug-induced tremor: Secondary | ICD-10-CM

## 2021-05-28 DIAGNOSIS — F319 Bipolar disorder, unspecified: Secondary | ICD-10-CM | POA: Diagnosis not present

## 2021-05-28 DIAGNOSIS — Z79899 Other long term (current) drug therapy: Secondary | ICD-10-CM

## 2021-05-28 DIAGNOSIS — R69 Illness, unspecified: Secondary | ICD-10-CM | POA: Diagnosis not present

## 2021-05-28 MED ORDER — LITHIUM CARBONATE ER 300 MG PO TBCR: 900.0000 mg | EXTENDED_RELEASE_TABLET | Freq: Every day | ORAL | 1 refills | Status: DC

## 2021-05-28 MED ORDER — CARBAMAZEPINE ER 200 MG PO CP12: ORAL_CAPSULE | ORAL | 1 refills | Status: DC

## 2021-05-28 NOTE — Progress Notes (Signed)
Christine Cisneros [id number] 1971/02/19 50 y.o.     Subjective:   Patient ID:  Christine Cisneros is a 50 y.o. (DOB July 20, 1971) female.  Chief Complaint:  Chief Complaint  Patient presents with   Follow-up    bipolar    HPI Christine Cisneros presents to the office today for follow-up of bipolar disorder.    when seen August 20, 2019.[name] She was doing well no meds were changed.  She had a hospitalization for 24 hours on November 3 with lithium toxicity with a level of 2.32.  The pharmacist review of her meds and supplements suggested it was related to a zinc and pumpkin seed supplement.. Lithium level on DC November 4 = 1.17.  On November 6 she would advise to resume lithium at a lower dosage of 300 mg in the morning and 600 mg in the evening on Monday Wednesday and Friday and 600 mg twice daily on the other days of the week.  It was recommended to repeat lithium level in about 2 weeks from that date.  seen November 19, 2019.  The following was noted.  No tremor or Sx lithium toxicity and lithium 1.0.  No mood sx either on. No recent med changes. Recovered pretty well except started tremor in legs a little today.  And didn't have tremors before the toxicity.   Mood is fine overall.  But is a little anxious over the situation. Has chronic anxiety about being sick and it upset her sense of stability to have the episode of lithium toxicity.  Lithium level and BMP were ordered.  As of appointment February 18, 2020: Several lithium levels have been obtained since her last appointment.  Last lithium level was 0.7 on February 04, 2020 at 900 mg daily.  That was the same level as 2 weeks prior and no meds were changed. No problems since the last visit. Because of recent problems with lithium toxicity the following was discussed: Disc labs including CMP and elevated Alk Phos and suggest FU with PCP. Disc lithium level, CBZ level, etc. She's nervous about the lower lithium dosage with fear of mania. Consider increase CBZ to  compensate for lower lithium dose. Also low dose CBZ.  She agrees to increase CBZ and wants to switch to Carbatrol bc hx double vision with higher doses CBZ IR. Carbatrol XR 400 AM and 600 PM Disc SE Check lithium at one month after last level and then repeat every 3 mos  04/11/2020 phone call patient experiencing vertigo wondering what her lithium levels were. Last lithium level received 0.6 from May 29, 2020 still on 900 mg daily with no med changes.  06/02/20 APPT with the following noted: Lab requests not being received at either lab, [facility] or [facility] when sent. No med changes since here.  Labs stable.  No mood swings.  Vertigo resolved after a few days.  No SE with either lithium 900 or CBZ ER 400 AM and 600PM.  Rare diarrhea with diet changes. 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 Now changed to [facility]   No med changes  11/24/20 appt noted: Parents doing well and good Xmas. Disc concern from PCP who asked about lithium.  She then had another doctor question her use of lithium but without any specific reason.  Wonders if she should continue lithium.  Discussed the doubt that that created.  05/28/21 appt noted: M knee replacement and helping her. No problems with meds or SE.  No mood swings. Has PCP checking other labs.  Patient reports stable mood and denies depressed or irritable moods.  No mania.  Patient denies any recent difficulty with anxiety.  Patient denies difficulty with sleep initiation or maintenance. Denies appetite disturbance.  Patient reports that energy and motivation have been good.  Patient denies any difficulty with concentration.  Patient denies any suicidal ideation.  Changing PCP to [facility].  Past Psychiatric Medication Trials: Lithium 1200 daily, carbamazepine 400 twice daily, Depakote weight gain and tired,  olanzapine, Geodon no response, Abilify 15,  Ambien  Review of Systems:  Review of Systems   HENT:  Negative for ear pain.   Gastrointestinal:  Negative for diarrhea.  Neurological:  Negative for tremors and weakness.   Medications: I have reviewed the patient's current medications.  Current Outpatient Medications  Medication Sig Dispense Refill   calcium carbonate 200 MG capsule Take 600 mg by mouth daily.      carbamazepine (CARBATROL) 200 MG 12 hr capsule 2 in the AM and 3 in PM 450 capsule 1   cholecalciferol (VITAMIN D) 1000 UNITS tablet Take 5,000 Units by mouth daily.      Chromium 200 MCG TABS Take by mouth.     lisinopril (ZESTRIL) 20 MG tablet Take 1 tablet (20 mg total) by mouth daily. 90 tablet 1   lithium carbonate (LITHOBID) 300 MG CR tablet Take 3 tablets (900 mg total) by mouth daily. 270 tablet 1   metoprolol tartrate (LOPRESSOR) 100 MG tablet TAKE 1 TABLET (100 MG TOTAL) BY MOUTH 2 (TWO) TIMES DAILY. NEEDS APPOINTMENT 60 tablet 0   Multiple Vitamin (MULTIVITAMIN) tablet Take 1 tablet by mouth daily.     No current facility-administered medications for this visit.    Medication Side Effects: None  Allergies:  Allergies  Allergen Reactions   Norvasc [Amlodipine Besylate] Shortness Of Breath and Nausea And Vomiting    chest tightness    Past Medical History:  Diagnosis Date   Bipolar 1 disorder ([place])    Hypertension     Family History  Problem Relation Age of Onset   Cancer Father        melanoma   Hyperlipidemia Father    Diabetes Maternal Grandfather    Diabetes Paternal Grandmother     Social History   Socioeconomic History   Marital status: Single    Spouse name: Not on file   Number of children: Not on file   Years of education: Not on file   Highest education level: Not on file  Occupational History   Occupation: RN  Tobacco Use   Smoking status: Never   Smokeless tobacco: Never  Substance and Sexual Activity   Alcohol use: No    Alcohol/week: 0.0 standard drinks   Drug use: No   Sexual activity: Yes    Partners: Male   Other Topics Concern   Not on file  Social History Narrative   Not on file   Social Determinants of Health   Financial Resource Strain: Not on file  Food Insecurity: Not on file  Transportation Needs: Not on file  Physical Activity: Not on file  Stress: Not on file  Social Connections: Not on file  Intimate Partner Violence: Not on file    Past Medical History, Surgical history, Social history, and Family history were reviewed and updated as appropriate.   Please see review of systems for further details on the patient's review from today.   Objective:   Physical Exam:  There were no vitals taken for this visit.  Physical Exam Constitutional:      General: She is not in acute distress.    Appearance: Normal appearance. She is obese.  Musculoskeletal:        General: No deformity.  Neurological:     Mental Status: She is alert and oriented to person, place, and time.     Coordination: Coordination normal.     Comments: Mild fine tremor in the hands and in the right leg.  Balance is good  Psychiatric:        Attention and Perception: Attention and perception normal. She does not perceive auditory or visual hallucinations.        Mood and Affect: Mood is not anxious or depressed. Affect is not labile, blunt, angry or inappropriate.        Speech: Speech normal. Speech is not rapid and pressured or slurred.        Behavior: Behavior normal. Behavior is not aggressive. Behavior is cooperative.        Thought Content: Thought content normal. Thought content is not paranoid or delusional. Thought content does not include homicidal or suicidal ideation. Thought content does not include homicidal or suicidal plan.        Cognition and Memory: Cognition and memory normal.        Judgment: Judgment normal.     Comments: Insight good.    Lab Review:     Component Value Date/Time   NA 137 10/05/2019 0825   K 5.4 (H) 10/05/2019 0825   CL 102 10/05/2019 0825   CO2 26 10/05/2019  0825   GLUCOSE 100 (H) 10/05/2019 0825   BUN 16 10/05/2019 0825   CREATININE 0.91 10/05/2019 0825   CALCIUM 10.1 10/05/2019 0825   PROT 8.2 (H) 09/18/2019 1551   ALBUMIN 4.7 09/18/2019 1551   AST 22 09/18/2019 1551   ALT 40 09/18/2019 1551   ALKPHOS 145 (H) 09/18/2019 1551   BILITOT 0.4 09/18/2019 1551   GFRNONAA >60 09/19/2019 1121   GFRNONAA 79 06/01/2019 1545   GFRAA >60 09/19/2019 1121   GFRAA 91 06/01/2019 1545       Component Value Date/Time   WBC 10.6 (H) 09/18/2019 1551   RBC 3.87 09/18/2019 1551   HGB 12.4 09/18/2019 1551   HCT 37.0 09/18/2019 1551   PLT 340 09/18/2019 1551   MCV 95.6 09/18/2019 1551   MCH 32.0 09/18/2019 1551   MCHC 33.5 09/18/2019 1551   RDW 12.1 09/18/2019 1551   LYMPHSABS 1.0 09/18/2019 1551   MONOABS 0.6 09/18/2019 1551   EOSABS 0.1 09/18/2019 1551   BASOSABS 0.0 09/18/2019 1551    Lithium  Date Value Ref Range Status  08/02/2016 0.7  Final   Lithium Lvl  Date Value Ref Range Status  03/19/2021 0.7 0.5 - 1.2 mmol/L Final    Comment:    Plasma concentration of 0.5 - 0.8 mmol/L are advised for long-term use; concentrations of up to 1.2 mmol/L may be necessary during acute treatment.                                  Detection Limit = 0.1                           <0.1 indicates None Detected   March 03, 2020 lithium level 0.6 on 900 mg daily.  No med  changes  Lab Results  Component Value Date   CBMZ 4.0 09/19/2019     .res Assessment: Plan:    Bipolar I disorder ([place])  Lithium-induced tremor  Lithium use   lithium toxicity 09/2019.  Her bipolar disorder has been relatively stable for several years.  She had a couple of severe manic episodes around 2006 and had to be hospitalized for 1 of them.  lithium toxicity 09/2019.  Lithium dose reduced from 1200 to 900 mg daily.  Doing well with CBZ and lithium until the lithium toxicity September 18 2019 with hospital stay.. Mood is stable.  She is on a lower dose of lithium but has  a good blood level of 0.7 and that is been consistent over the last couple of checks.  Counseled patient regarding potential benefits, risks, and side effects of lithium to include potential risk of lithium affecting thyroid and renal function.  Discussed need for periodic lab monitoring to determine drug level and to assess for potential adverse effects.  Counseled patient regarding signs and symptoms of lithium toxicity and advised that they notify office immediately or seek urgent medical attention if experiencing these signs and symptoms.  Patient advised to contact office with any questions or concerns.  Discussed that some blood pressure meds can affect lithium levels and cause him to go up.  Discussed the type of blood pressure meds she can use and there potential effects on lithium levels which can be monitored.  The most important type to avoid would be the thiazide diuretics.  We also discussed nonsteroidal anti-inflammatories and their risks with lithium. Rare lithium tremor resolved.   Disc lithium level, CBZ level, etc. She's nervous about the lower lithium dosage with fear of mania. Mania has not recurred with less lithium.   Consider increase CBZ to compensate for lower lithium dose if needed. Also low dose CBZ.  Stable mood. Carbatrol XR 400 AM and 600 PM Disc SE .  Vertigo resolved with current carbamazepine dosing.  Check lithium at one month after last level and then repeat every 3 mos March 03, 2020 lithium level 0.6 on 900 mg daily.  No med changes lithium level received 0.6 from May 29, 2020 still on 900 mg daily with no med changes. 03/19/21 lithium level 0.7 Normal CMP and TSH Repeat in August  Disc in detail risk factors for lithium toxicity: Gave her a copy and discussed the contents of the article by Dr. Herbie [place] post published in 2018 on the underutilization of lithium.  Her primary care doctor has been critical of her being on lithium even though she has no adverse  effects.  Educated her about the long-term benefits of lithium as opposed to other mood stabilizers and that in her case the only alternative would be atypical antipsychotics.  Discussed long-term side effect risks of those medications as well.  She elects to continue the lithium as it is my recommendation.  She wants to avoid the risk of manic psychosis that she experienced years ago.  FU 4 mos  Lynder Parents, MD, DFAPA  Please see After Visit Summary for patient specific instructions.  No future appointments.   No orders of the defined types were placed in this encounter.     -------------------------------

## 2021-06-17 ENCOUNTER — Other Ambulatory Visit: Payer: Self-pay | Admitting: Psychiatry

## 2021-06-17 DIAGNOSIS — F319 Bipolar disorder, unspecified: Secondary | ICD-10-CM

## 2021-07-23 DIAGNOSIS — G251 Drug-induced tremor: Secondary | ICD-10-CM | POA: Diagnosis not present

## 2021-07-23 DIAGNOSIS — R69 Illness, unspecified: Secondary | ICD-10-CM | POA: Diagnosis not present

## 2021-07-24 LAB — LITHIUM LEVEL: Lithium Lvl: 0.6 mmol/L (ref 0.5–1.2)

## 2021-07-27 NOTE — Progress Notes (Signed)
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  Her blood levels tend to be there is 0.6 or 0.7.  This 1 is 0.6 and is good.  No change in dosages indicated.  Please let her know.

## 2021-07-28 NOTE — Progress Notes (Signed)
Pt informed and also she wanted to let us know the results and the comments from you are visible to her in the portal just [facility]

## 2021-08-27 DIAGNOSIS — J3089 Other allergic rhinitis: Secondary | ICD-10-CM | POA: Diagnosis not present

## 2021-08-27 DIAGNOSIS — H60543 Acute eczematoid otitis externa, bilateral: Secondary | ICD-10-CM | POA: Diagnosis not present

## 2021-08-27 DIAGNOSIS — I10 Essential (primary) hypertension: Secondary | ICD-10-CM | POA: Diagnosis not present

## 2021-08-27 DIAGNOSIS — Z23 Encounter for immunization: Secondary | ICD-10-CM | POA: Diagnosis not present

## 2021-10-01 ENCOUNTER — Ambulatory Visit (INDEPENDENT_AMBULATORY_CARE_PROVIDER_SITE_OTHER): Payer: 59 | Admitting: Psychiatry

## 2021-10-01 ENCOUNTER — Encounter: Payer: Self-pay | Admitting: Psychiatry

## 2021-10-01 DIAGNOSIS — G251 Drug-induced tremor: Secondary | ICD-10-CM

## 2021-10-01 DIAGNOSIS — Z79899 Other long term (current) drug therapy: Secondary | ICD-10-CM

## 2021-10-01 DIAGNOSIS — F319 Bipolar disorder, unspecified: Secondary | ICD-10-CM

## 2021-10-01 DIAGNOSIS — R69 Illness, unspecified: Secondary | ICD-10-CM | POA: Diagnosis not present

## 2021-10-01 MED ORDER — CARBAMAZEPINE ER 200 MG PO CP12: ORAL_CAPSULE | ORAL | 1 refills | Status: DC

## 2021-10-01 MED ORDER — LITHIUM CARBONATE ER 300 MG PO TBCR: 900.0000 mg | EXTENDED_RELEASE_TABLET | Freq: Every day | ORAL | 1 refills | Status: DC

## 2021-10-01 NOTE — Progress Notes (Signed)
Christine Cisneros [id number] 05-04-1971 50 y.o.     Subjective:   Patient ID:  Christine Cisneros is a 50 y.o. (DOB 04-Oct-1971) female.  Chief Complaint:  Chief Complaint  Patient presents with   Follow-up    Bipolar I disorder ([place])    HPI Christine Cisneros presents to the office today for follow-up of bipolar disorder.    when seen August 20, 2019.[name] She was doing well no meds were changed.  She had a hospitalization for 24 hours on September 18, 2019 with lithium toxicity with a level of 2.32.  The pharmacist review of her meds and supplements suggested it was related to a zinc and pumpkin seed supplement.. Lithium level on DC November 4 = 1.17.  On September 21, 2019 she would advise to resume lithium at a lower dosage of 300 mg in the morning and 600 mg in the evening on Monday Wednesday and Friday and 600 mg twice daily on the other days of the week.  It was recommended to repeat lithium level in about 2 weeks from that date.  seen November 19, 2019.  The following was noted.  No tremor or Sx lithium toxicity and lithium 1.0.  No mood sx either on. No recent med changes. Recovered pretty well except started tremor in legs a little today.  And didn't have tremors before the toxicity.   Mood is fine overall.  But is a little anxious over the situation. Has chronic anxiety about being sick and it upset her sense of stability to have the episode of lithium toxicity.  Lithium level and BMP were ordered.  As of appointment February 18, 2020: Several lithium levels have been obtained since her last appointment.  Last lithium level was 0.7 on February 04, 2020 at 900 mg daily.  That was the same level as 2 weeks prior and no meds were changed. No problems since the last visit. Because of recent problems with lithium toxicity the following was discussed: Disc labs including CMP and elevated Alk Phos and suggest FU with PCP. Disc lithium level, CBZ level, etc. She's nervous about the lower lithium dosage with fear of mania.  Consider increase CBZ to compensate for lower lithium dose. Also low dose CBZ.  She agrees to increase CBZ and wants to switch to Carbatrol bc hx double vision with higher doses CBZ IR. Carbatrol XR 400 AM and 600 PM Disc SE Check lithium at one month after last level and then repeat every 3 mos  04/11/2020 phone call patient experiencing vertigo wondering what her lithium levels were. Last lithium level received 0.6 from May 29, 2020 still on 900 mg daily with no med changes.  06/02/20 APPT with the following noted: Lab requests not being received at either lab, [facility] or [facility] when sent. No med changes since here.  Labs stable.  No mood swings.  Vertigo resolved after a few days.  No SE with either lithium 900 or CBZ ER 400 AM and 600PM.  Rare diarrhea with diet changes. 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 Now changed to [facility].   No med changes  11/24/20 appt noted: Parents doing well and good Xmas. Disc concern from PCP who asked about lithium.  She then had another doctor question her use of lithium but without any specific reason.  Wonders if she should continue lithium.  Discussed the doubt that that created.  05/28/21 appt noted: M knee replacement and helping her. No  problems with meds or SE.  No mood swings. Has PCP checking other labs. Plan: Continue current meds, lithium 900 mg q. at bedtime, carbamazepine XR 600 mg every morning and 900 mg nightly.  10/01/2021 appointment with the following noted: Off lisinopril bc Cough.  Now on metoprolol BID working well. Still doing great without mood swings.  Sleep is fine.   PCP [name].  Really good.  No concerns expressed over lithium. SE none except occ metallic taste.  Was worse after lithium toxicity. No vertigo. Son Christine Cisneros working on [employer] and parents doing well.  Patient reports stable mood and denies depressed or irritable moods.  No mania.  Patient denies any  recent difficulty with anxiety.  Patient denies difficulty with sleep initiation or maintenance. Denies appetite disturbance.  Patient reports that energy and motivation have been good.  Patient denies any difficulty with concentration.  Patient denies any suicidal ideation.  Changing PCP to [facility]  Past Psychiatric Medication Trials: Lithium 1200 daily, carbamazepine 400 twice daily, Depakote weight gain and tired,  olanzapine, Geodon no response, Abilify 15,  Ambien  Review of Systems:  Review of Systems  HENT:  Negative for ear pain.   Cardiovascular:  Negative for palpitations.  Gastrointestinal:  Negative for diarrhea.  Neurological:  Negative for tremors and weakness.   Medications: I have reviewed the patient's current medications.  Current Outpatient Medications  Medication Sig Dispense Refill   calcium carbonate 200 MG capsule Take 600 mg by mouth daily.      cholecalciferol (VITAMIN D) 1000 UNITS tablet Take 5,000 Units by mouth daily.      Chromium 200 MCG TABS Take by mouth.     metoprolol tartrate (LOPRESSOR) 100 MG tablet TAKE 1 TABLET (100 MG TOTAL) BY MOUTH 2 (TWO) TIMES DAILY. NEEDS APPOINTMENT 60 tablet 0   Multiple Vitamin (MULTIVITAMIN) tablet Take 1 tablet by mouth daily.     carbamazepine (CARBATROL) 200 MG 12 hr capsule TAKE 2 CAPSULES EVERY      MORNING AND 3 CAPSULES IN  THE EVENING 450 capsule 1   lithium carbonate (LITHOBID) 300 MG CR tablet Take 3 tablets (900 mg total) by mouth daily. 270 tablet 1   No current facility-administered medications for this visit.    Medication Side Effects: None  Allergies:  Allergies  Allergen Reactions   Norvasc [Amlodipine Besylate] Shortness Of Breath and Nausea And Vomiting    chest tightness    Past Medical History:  Diagnosis Date   Bipolar 1 disorder ([place])    Hypertension     Family History  Problem Relation Age of Onset   Cancer Father        melanoma   Hyperlipidemia Father    Diabetes Maternal  Grandfather    Diabetes Paternal Grandmother     Social History   Socioeconomic History   Marital status: Single    Spouse name: Not on file   Number of children: Not on file   Years of education: Not on file   Highest education level: Not on file  Occupational History   Occupation: RN  Tobacco Use   Smoking status: Never   Smokeless tobacco: Never  Substance and Sexual Activity   Alcohol use: No    Alcohol/week: 0.0 standard drinks   Drug use: No   Sexual activity: Yes    Partners: Male  Other Topics Concern   Not on file  Social History Narrative   Not on file   Social Determinants of  Health   Financial Resource Strain: Not on file  Food Insecurity: Not on file  Transportation Needs: Not on file  Physical Activity: Not on file  Stress: Not on file  Social Connections: Not on file  Intimate Partner Violence: Not on file    Past Medical History, Surgical history, Social history, and Family history were reviewed and updated as appropriate.   Please see review of systems for further details on the patient's review from today.   Objective:   Physical Exam:  There were no vitals taken for this visit.  Physical Exam Constitutional:      General: She is not in acute distress.    Appearance: Normal appearance. She is obese.  Musculoskeletal:        General: No deformity.  Neurological:     Mental Status: She is alert and oriented to person, place, and time.     Coordination: Coordination normal.     Comments: Mild fine tremor in the hands and in the right leg.  Balance is good  Psychiatric:        Attention and Perception: Attention and perception normal. She does not perceive auditory or visual hallucinations.        Mood and Affect: Mood is not anxious or depressed. Affect is not labile, blunt, angry or inappropriate.        Speech: Speech normal. Speech is not rapid and pressured or slurred.        Behavior: Behavior normal. Behavior is not aggressive.  Behavior is cooperative.        Thought Content: Thought content normal. Thought content is not paranoid or delusional. Thought content does not include homicidal or suicidal ideation.        Cognition and Memory: Cognition and memory normal.        Judgment: Judgment normal.     Comments: Insight good.    Lab Review:     Component Value Date/Time   NA 137 10/05/2019 0825   K 5.4 (H) 10/05/2019 0825   CL 102 10/05/2019 0825   CO2 26 10/05/2019 0825   GLUCOSE 100 (H) 10/05/2019 0825   BUN 16 10/05/2019 0825   CREATININE 0.91 10/05/2019 0825   CALCIUM 10.1 10/05/2019 0825   PROT 8.2 (H) 09/18/2019 1551   ALBUMIN 4.7 09/18/2019 1551   AST 22 09/18/2019 1551   ALT 40 09/18/2019 1551   ALKPHOS 145 (H) 09/18/2019 1551   BILITOT 0.4 09/18/2019 1551   GFRNONAA >60 09/19/2019 1121   GFRNONAA 79 06/01/2019 1545   GFRAA >60 09/19/2019 1121   GFRAA 91 06/01/2019 1545       Component Value Date/Time   WBC 10.6 (H) 09/18/2019 1551   RBC 3.87 09/18/2019 1551   HGB 12.4 09/18/2019 1551   HCT 37.0 09/18/2019 1551   PLT 340 09/18/2019 1551   MCV 95.6 09/18/2019 1551   MCH 32.0 09/18/2019 1551   MCHC 33.5 09/18/2019 1551   RDW 12.1 09/18/2019 1551   LYMPHSABS 1.0 09/18/2019 1551   MONOABS 0.6 09/18/2019 1551   EOSABS 0.1 09/18/2019 1551   BASOSABS 0.0 09/18/2019 1551    Lithium  Date Value Ref Range Status  08/02/2016 0.7  Final   Lithium Lvl  Date Value Ref Range Status  07/23/2021 0.6 0.5 - 1.2 mmol/L Final    Comment:    Plasma concentration of 0.5 - 0.8 mmol/L are advised for long-term use; concentrations of up to 1.2 mmol/L may be necessary during acute treatment.  Detection Limit = 0.1                           <0.1 indicates None Detected   March 03, 2020 lithium level 0.6 on 900 mg daily.  No med changes  Lab Results  Component Value Date   CBMZ 4.0 09/19/2019     .res Assessment: Plan:    Bipolar I disorder ([place]) - Plan:  lithium carbonate (LITHOBID) 300 MG CR tablet, carbamazepine (CARBATROL) 200 MG 12 hr capsule  Lithium-induced tremor  Lithium use    lithium toxicity 09/2019.  Her bipolar disorder has been relatively stable for several years.  She had a couple of severe manic episodes around 2006 and had to be hospitalized for 1 of them.  lithium toxicity 09/2019.  Lithium dose reduced from 1200 to 900 mg daily. Mood has been stable since reduction in lithium dose.  Doing well with CBZ and lithium until the lithium toxicity September 18 2019 with hospital stay.. Mood is stable.  She is on a lower dose of lithium but has a good blood level of 0.6 and that is been consistent over the last couple of checks.  Counseled patient regarding potential benefits, risks, and side effects of lithium to include potential risk of lithium affecting thyroid and renal function.  Discussed need for periodic lab monitoring to determine drug level and to assess for potential adverse effects.  Counseled patient regarding signs and symptoms of lithium toxicity and advised that they notify office immediately or seek urgent medical attention if experiencing these signs and symptoms.  Patient advised to contact office with any questions or concerns.  Discussed that some blood pressure meds can affect lithium levels and cause him to go up.  Discussed the type of blood pressure meds she can use and there potential effects on lithium levels which can be monitored.  The most important type to avoid would be the thiazide diuretics.  We also discussed nonsteroidal anti-inflammatories and their risks with lithium. Rare lithium tremor resolved.   Disc lithium level, CBZ level, etc. She's nervous about the lower lithium dosage with fear of mania. Mania has not recurred with less lithium.   Consider increase CBZ to compensate for lower lithium dose if needed. Also low dose CBZ.  Stable mood. Carbatrol XR 400 AM and 600 PM Disc SE .  Vertigo  resolved with current carbamazepine dosing.  Check lithium at one month after last level and then repeat every 3 mos 07/23/21 lithium 0.6 stable. Normal CMP and TSH 12/2020  Disc in detail risk factors for lithium toxicity: Gave her a copy and discussed the contents of the article by Dr. Herbie Baltimore post published in 2018 on the underutilization of lithium.  Her primary care doctor has been critical of her being on lithium even though she has no adverse effects.  Educated her about the long-term benefits of lithium as opposed to other mood stabilizers and that in her case the only alternative would be atypical antipsychotics.  Discussed long-term side effect risks of those medications as well.  She elects to continue the lithium as it is my recommendation.  She wants to avoid the risk of manic psychosis that she experienced years ago.  FU 4 mos  Lynder Parents, MD, DFAPA  Please see After Visit Summary for patient specific instructions.  No future appointments.   No orders of the defined types were placed in this encounter.     -------------------------------

## 2021-10-15 DIAGNOSIS — M2022 Hallux rigidus, left foot: Secondary | ICD-10-CM | POA: Diagnosis not present

## 2021-10-15 DIAGNOSIS — M79672 Pain in left foot: Secondary | ICD-10-CM | POA: Diagnosis not present

## 2021-11-14 ENCOUNTER — Telehealth: Payer: Self-pay | Admitting: Behavioral Health

## 2021-11-14 NOTE — Telephone Encounter (Signed)
Pt called in 12/31 at 10:43 am to after hours emergency number. On return call she stated that she had tested positive today for Covid. She wanted to know if it was safe to continue her Lithium at this time. Using guidance from [facility], I told her to continue taking the medication as long as her symptoms remained mild and to make sure she stayed hydrated. She said she was already scheduled to get Lithium levels this coming Tuesday for first time in a few months. Advised her to follow up with her provider Dr. Jennelle Human after her labs next week and that she should also consult her PCP with further guidance on her questions on new antivirals that can be prescribed.

## 2021-11-26 ENCOUNTER — Other Ambulatory Visit: Payer: Self-pay | Admitting: Psychiatry

## 2021-11-26 ENCOUNTER — Telehealth: Payer: Self-pay | Admitting: Psychiatry

## 2021-11-26 DIAGNOSIS — I10 Essential (primary) hypertension: Secondary | ICD-10-CM | POA: Diagnosis not present

## 2021-11-26 DIAGNOSIS — F319 Bipolar disorder, unspecified: Secondary | ICD-10-CM

## 2021-11-26 DIAGNOSIS — Z79899 Other long term (current) drug therapy: Secondary | ICD-10-CM

## 2021-11-26 NOTE — Telephone Encounter (Signed)
faxed

## 2021-11-26 NOTE — Telephone Encounter (Signed)
Eren is going to [facility] in [place] tomorrow morning and needs a lab order for her Lithium level faxed to them at [phone]. Their main number is [phone].

## 2021-11-26 NOTE — Telephone Encounter (Signed)
Please print order and I can fax

## 2021-11-26 NOTE — Telephone Encounter (Signed)
I printed and signed them and placed them in your box to fax.  Thank you

## 2021-11-27 DIAGNOSIS — Z79899 Other long term (current) drug therapy: Secondary | ICD-10-CM | POA: Diagnosis not present

## 2021-11-27 DIAGNOSIS — R69 Illness, unspecified: Secondary | ICD-10-CM | POA: Diagnosis not present

## 2021-12-17 DIAGNOSIS — H04123 Dry eye syndrome of bilateral lacrimal glands: Secondary | ICD-10-CM | POA: Diagnosis not present

## 2021-12-17 DIAGNOSIS — H02889 Meibomian gland dysfunction of unspecified eye, unspecified eyelid: Secondary | ICD-10-CM | POA: Diagnosis not present

## 2022-01-28 DIAGNOSIS — Z0000 Encounter for general adult medical examination without abnormal findings: Secondary | ICD-10-CM | POA: Diagnosis not present

## 2022-01-28 DIAGNOSIS — R69 Illness, unspecified: Secondary | ICD-10-CM | POA: Diagnosis not present

## 2022-01-28 DIAGNOSIS — Z1211 Encounter for screening for malignant neoplasm of colon: Secondary | ICD-10-CM | POA: Diagnosis not present

## 2022-01-28 DIAGNOSIS — E782 Mixed hyperlipidemia: Secondary | ICD-10-CM | POA: Diagnosis not present

## 2022-01-28 DIAGNOSIS — I10 Essential (primary) hypertension: Secondary | ICD-10-CM | POA: Diagnosis not present

## 2022-01-29 DIAGNOSIS — E782 Mixed hyperlipidemia: Secondary | ICD-10-CM | POA: Diagnosis not present

## 2022-01-29 DIAGNOSIS — R69 Illness, unspecified: Secondary | ICD-10-CM | POA: Diagnosis not present

## 2022-01-29 DIAGNOSIS — I10 Essential (primary) hypertension: Secondary | ICD-10-CM | POA: Diagnosis not present

## 2022-02-09 DIAGNOSIS — Z1212 Encounter for screening for malignant neoplasm of rectum: Secondary | ICD-10-CM | POA: Diagnosis not present

## 2022-02-09 DIAGNOSIS — Z1211 Encounter for screening for malignant neoplasm of colon: Secondary | ICD-10-CM | POA: Diagnosis not present

## 2022-02-18 DIAGNOSIS — D485 Neoplasm of uncertain behavior of skin: Secondary | ICD-10-CM | POA: Diagnosis not present

## 2022-02-18 DIAGNOSIS — L738 Other specified follicular disorders: Secondary | ICD-10-CM | POA: Diagnosis not present

## 2022-03-18 DIAGNOSIS — R69 Illness, unspecified: Secondary | ICD-10-CM | POA: Diagnosis not present

## 2022-03-18 DIAGNOSIS — Z79899 Other long term (current) drug therapy: Secondary | ICD-10-CM | POA: Diagnosis not present

## 2022-03-19 ENCOUNTER — Encounter: Payer: Self-pay | Admitting: Psychiatry

## 2022-03-19 NOTE — Progress Notes (Signed)
Lithium level 0.7.  stable.  No med changes

## 2022-04-01 ENCOUNTER — Telehealth (INDEPENDENT_AMBULATORY_CARE_PROVIDER_SITE_OTHER): Payer: 59 | Admitting: Psychiatry

## 2022-04-01 ENCOUNTER — Encounter: Payer: Self-pay | Admitting: Psychiatry

## 2022-04-01 DIAGNOSIS — F319 Bipolar disorder, unspecified: Secondary | ICD-10-CM | POA: Diagnosis not present

## 2022-04-01 DIAGNOSIS — G251 Drug-induced tremor: Secondary | ICD-10-CM | POA: Diagnosis not present

## 2022-04-01 DIAGNOSIS — Z79899 Other long term (current) drug therapy: Secondary | ICD-10-CM | POA: Diagnosis not present

## 2022-04-01 DIAGNOSIS — R69 Illness, unspecified: Secondary | ICD-10-CM | POA: Diagnosis not present

## 2022-04-01 DIAGNOSIS — H00025 Hordeolum internum left lower eyelid: Secondary | ICD-10-CM | POA: Diagnosis not present

## 2022-04-01 MED ORDER — LITHIUM CARBONATE ER 300 MG PO TBCR: 900.0000 mg | EXTENDED_RELEASE_TABLET | Freq: Every day | ORAL | 1 refills | Status: DC

## 2022-04-01 MED ORDER — CARBAMAZEPINE ER 200 MG PO CP12: ORAL_CAPSULE | ORAL | 1 refills | Status: DC

## 2022-04-01 NOTE — Progress Notes (Signed)
Christine Cisneros [id number] 1970/12/08 51 y.o.     Subjective:   Patient ID:  Christine Cisneros is a 50 y.o. (DOB 1970/12/19) female.  Chief Complaint:  Chief Complaint  Patient presents with   Follow-up    HPI Christine Cisneros presents to the office today for follow-up of bipolar disorder.    when seen August 20, 2019.[name] She was doing well no meds were changed.  She had a hospitalization for 24 hours on September 18, 2019 with lithium toxicity with a level of 2.32.  The pharmacist review of her meds and supplements suggested it was related to a zinc and pumpkin seed supplement.. Lithium level on DC November 4 = 1.17.  On September 21, 2019 she would advise to resume lithium at a lower dosage of 300 mg in the morning and 600 mg in the evening on Monday Wednesday and Friday and 600 mg twice daily on the other days of the week.  It was recommended to repeat lithium level in about 2 weeks from that date.  seen November 19, 2019.  The following was noted.  No tremor or Sx lithium toxicity and lithium 1.0.  No mood sx either on. No recent med changes. Recovered pretty well except started tremor in legs a little today.  And didn't have tremors before the toxicity.   Mood is fine overall.  But is a little anxious over the situation. Has chronic anxiety about being sick and it upset her sense of stability to have the episode of lithium toxicity.  Lithium level and BMP were ordered.  As of appointment February 18, 2020: Several lithium levels have been obtained since her last appointment.  Last lithium level was 0.7 on February 04, 2020 at 900 mg daily.  That was the same level as 2 weeks prior and no meds were changed. No problems since the last visit. Because of recent problems with lithium toxicity the following was discussed: Disc labs including CMP and elevated Alk Phos and suggest FU with PCP. Disc lithium level, CBZ level, etc. She's nervous about the lower lithium dosage with fear of mania. Consider increase CBZ to  compensate for lower lithium dose. Also low dose CBZ.  She agrees to increase CBZ and wants to switch to Carbatrol bc hx double vision with higher doses CBZ IR. Carbatrol XR 400 AM and 600 PM Disc SE Check lithium at one month after last level and then repeat every 3 mos  04/11/2020 phone call patient experiencing vertigo wondering what her lithium levels were. Last lithium level received 0.6 from May 29, 2020 still on 900 mg daily with no med changes.  06/02/20 APPT with the following noted: Lab requests not being received at either lab, [facility] or [facility] when sent. No med changes since here.  Labs stable.  No mood swings.  Vertigo resolved after a few days.  No SE with either lithium 900 or CBZ ER 400 AM and 600PM.  Rare diarrhea with diet changes. 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 Now changed to [facility].   No med changes  11/24/20 appt noted: Parents doing well and good Xmas. Disc concern from PCP who asked about lithium.  She then had another doctor question her use of lithium but without any specific reason.  Wonders if she should continue lithium.  Discussed the doubt that that created.  05/28/21 appt noted: M knee replacement and helping her. No problems with meds or SE.  No  mood swings. Has PCP checking other labs. Plan: Continue current meds, lithium 900 mg q. at bedtime, carbamazepine XR 600 mg every morning and 900 mg nightly.  10/01/2021 appointment with the following noted: Off lisinopril bc Cough.  Now on metoprolol BID working well. Still doing great without mood swings.  Sleep is fine.   PCP [name].  Really good.  No concerns expressed over lithium. SE none except occ metallic taste.  Was worse after lithium toxicity. No vertigo. Son [name] working on [employer] and parents doing well. Patient reports stable mood and denies depressed or irritable moods.  No mania.  Patient denies any recent difficulty with  anxiety.  Patient denies difficulty with sleep initiation or maintenance. Denies appetite disturbance.  Patient reports that energy and motivation have been good.  Patient denies any difficulty with concentration.  Patient denies any suicidal ideation.  04/01/22 appt noted: Recovered well with Covid. Still doing well. Patient reports stable mood and denies depressed or irritable moods.  Patient denies any recent difficulty with anxiety.  Patient denies difficulty with sleep initiation or maintenance. Denies appetite disturbance.  Patient reports that energy and motivation have been good.  Patient denies any difficulty with concentration.  Patient denies any suicidal ideation. Lithium 0.7 and no SE including no tremor. On 900 mg daily  Changing PCP to [facility].  Past Psychiatric Medication Trials: Lithium 1200 daily, carbamazepine 400 twice daily, Depakote weight gain and tired,  olanzapine, Geodon no response, Abilify 15,  Ambien  Review of Systems:  Review of Systems  HENT:  Negative for ear pain.   Cardiovascular:  Negative for palpitations.  Gastrointestinal:  Negative for diarrhea.  Neurological:  Negative for tremors.   Medications: I have reviewed the patient's current medications.  Current Outpatient Medications  Medication Sig Dispense Refill   calcium carbonate 200 MG capsule Take 600 mg by mouth daily.      carbamazepine (CARBATROL) 200 MG 12 hr capsule TAKE 2 CAPSULES EVERY      MORNING AND 3 CAPSULES IN  THE EVENING 450 capsule 1   cholecalciferol (VITAMIN D) 1000 UNITS tablet Take 5,000 Units by mouth daily.      Chromium 200 MCG TABS Take by mouth.     lithium carbonate (LITHOBID) 300 MG CR tablet Take 3 tablets (900 mg total) by mouth daily. 270 tablet 1   metoprolol tartrate (LOPRESSOR) 100 MG tablet TAKE 1 TABLET (100 MG TOTAL) BY MOUTH 2 (TWO) TIMES DAILY. NEEDS APPOINTMENT (Patient taking differently: Take 100 mg by mouth 2 (two) times daily. NEEDS APPOINTMENT of ER)  60 tablet 0   Multiple Vitamin (MULTIVITAMIN) tablet Take 1 tablet by mouth daily.     No current facility-administered medications for this visit.    Medication Side Effects: None  Allergies:  Allergies  Allergen Reactions   Norvasc [Amlodipine Besylate] Shortness Of Breath and Nausea And Vomiting    chest tightness    Past Medical History:  Diagnosis Date   Bipolar 1 disorder ([place])    Hypertension     Family History  Problem Relation Age of Onset   Cancer Father        melanoma   Hyperlipidemia Father    Diabetes Maternal Grandfather    Diabetes Paternal Grandmother     Social History   Socioeconomic History   Marital status: Single    Spouse name: Not on file   Number of children: Not on file   Years of education: Not on file  Highest education level: Not on file  Occupational History   Occupation: RN  Tobacco Use   Smoking status: Never   Smokeless tobacco: Never  Substance and Sexual Activity   Alcohol use: No    Alcohol/week: 0.0 standard drinks   Drug use: No   Sexual activity: Yes    Partners: Male  Other Topics Concern   Not on file  Social History Narrative   Not on file   Social Determinants of Health   Financial Resource Strain: Not on file  Food Insecurity: Not on file  Transportation Needs: Not on file  Physical Activity: Not on file  Stress: Not on file  Social Connections: Not on file  Intimate Partner Violence: Not on file    Past Medical History, Surgical history, Social history, and Family history were reviewed and updated as appropriate.   Please see review of systems for further details on the patient's review from today.   Objective:   Physical Exam:  There were no vitals taken for this visit.  Physical Exam Constitutional:      General: She is not in acute distress.    Appearance: Normal appearance. She is obese.  Musculoskeletal:        General: No deformity.  Neurological:     Mental Status: She is alert and  oriented to person, place, and time.     Coordination: Coordination normal.     Comments: Mild fine tremor in the hands and in the right leg.  Balance is good  Psychiatric:        Attention and Perception: Attention and perception normal. She does not perceive auditory or visual hallucinations.        Mood and Affect: Mood is not anxious or depressed. Affect is not labile, angry or inappropriate.        Speech: Speech normal. Speech is not rapid and pressured or slurred.        Behavior: Behavior normal. Behavior is not aggressive. Behavior is cooperative.        Thought Content: Thought content normal. Thought content is not paranoid or delusional. Thought content does not include homicidal or suicidal ideation. Thought content does not include suicidal plan.        Cognition and Memory: Cognition and memory normal.        Judgment: Judgment normal.     Comments: Insight good.    Lab Review:     Component Value Date/Time   NA 137 10/05/2019 0825   K 5.4 (H) 10/05/2019 0825   CL 102 10/05/2019 0825   CO2 26 10/05/2019 0825   GLUCOSE 100 (H) 10/05/2019 0825   BUN 16 10/05/2019 0825   CREATININE 0.91 10/05/2019 0825   CALCIUM 10.1 10/05/2019 0825   PROT 8.2 (H) 09/18/2019 1551   ALBUMIN 4.7 09/18/2019 1551   AST 22 09/18/2019 1551   ALT 40 09/18/2019 1551   ALKPHOS 145 (H) 09/18/2019 1551   BILITOT 0.4 09/18/2019 1551   GFRNONAA >60 09/19/2019 1121   GFRNONAA 79 06/01/2019 1545   GFRAA >60 09/19/2019 1121   GFRAA 91 06/01/2019 1545       Component Value Date/Time   WBC 10.6 (H) 09/18/2019 1551   RBC 3.87 09/18/2019 1551   HGB 12.4 09/18/2019 1551   HCT 37.0 09/18/2019 1551   PLT 340 09/18/2019 1551   MCV 95.6 09/18/2019 1551   MCH 32.0 09/18/2019 1551   MCHC 33.5 09/18/2019 1551   RDW 12.1 09/18/2019 1551   LYMPHSABS 1.0  09/18/2019 1551   MONOABS 0.6 09/18/2019 1551   EOSABS 0.1 09/18/2019 1551   BASOSABS 0.0 09/18/2019 1551    Lithium  Date Value Ref Range Status   08/02/2016 0.7  Final   Lithium Lvl  Date Value Ref Range Status  07/23/2021 0.6 0.5 - 1.2 mmol/L Final    Comment:    Plasma concentration of 0.5 - 0.8 mmol/L are advised for long-term use; concentrations of up to 1.2 mmol/L may be necessary during acute treatment.                                  Detection Limit = 0.1                           <0.1 indicates None Detected   March 03, 2020 lithium level 0.6 on 900 mg daily.  No med changes  Lab Results  Component Value Date   CBMZ 4.0 09/19/2019     .res Assessment: Plan:    Bipolar I disorder ([place]) - Plan: lithium carbonate (LITHOBID) 300 MG CR tablet, carbamazepine (CARBATROL) 200 MG 12 hr capsule  Lithium use  Lithium-induced tremor    lithium toxicity 09/2019.  Her bipolar disorder has been relatively stable for several years.  She had a couple of severe manic episodes around 2006 and had to be hospitalized for 1 of them.  lithium toxicity 09/2019.  Lithium dose reduced from 1200 to 900 mg daily. Mood has been stable since reduction in lithium dose.  Doing well with CBZ and lithium until the lithium toxicity September 18 2019 with hospital stay.. Mood is stable.  She is on a lower dose of lithium but has a good blood level of 0.6 and that is been consistent over the last couple of checks.  Counseled patient regarding potential benefits, risks, and side effects of lithium to include potential risk of lithium affecting thyroid and renal function.  Discussed need for periodic lab monitoring to determine drug level and to assess for potential adverse effects.  Counseled patient regarding signs and symptoms of lithium toxicity and advised that they notify office immediately or seek urgent medical attention if experiencing these signs and symptoms.  Patient advised to contact office with any questions or concerns.  Discussed that some blood pressure meds can affect lithium levels and cause him to go up.  Discussed the type of blood  pressure meds she can use and there potential effects on lithium levels which can be monitored.  The most important type to avoid would be the thiazide diuretics.  We also discussed nonsteroidal anti-inflammatories and their risks with lithium. Rare lithium tremor resolved.   Disc lithium level, CBZ level, etc. She's nervous about the lower lithium dosage with fear of mania. Mania has not recurred with less lithium.   Consider increase CBZ to compensate for lower lithium dose if needed. Also low dose CBZ.  Stable mood. Carbatrol XR 400 AM and 600 PM Disc SE .  Vertigo resolved with current carbamazepine dosing.  Check lithium at one month after last level and then repeat every 3 mos 07/23/21 lithium 0.6 stable. Normal CMP and TSH 12/2020 03/2022 Lithium 0.7 and no SE including no tremor. On 900 mg daily 01/29/22 normal BMP and CA 9.8 and TSH  Disc in detail risk factors for lithium toxicity: Gave her a copy and discussed the contents of the article by  Dr. Herbie Baltimore post published in 2018 on the underutilization of lithium.  Her primary care doctor has been critical of her being on lithium even though she has no adverse effects.  Educated her about the long-term benefits of lithium as opposed to other mood stabilizers and that in her case the only alternative would be atypical antipsychotics.  Discussed long-term side effect risks of those medications as well.  She elects to continue the lithium as it is my recommendation.  She wants to avoid the risk of manic psychosis that she experienced years ago.  FU 4 -6 mos  Lynder Parents, MD, DFAPA  Please see After Visit Summary for patient specific instructions.  No future appointments.   No orders of the defined types were placed in this encounter.     -------------------------------

## 2022-05-13 NOTE — Telephone Encounter (Signed)
error 

## 2022-06-03 DIAGNOSIS — I10 Essential (primary) hypertension: Secondary | ICD-10-CM | POA: Diagnosis not present

## 2022-06-03 DIAGNOSIS — J3089 Other allergic rhinitis: Secondary | ICD-10-CM | POA: Diagnosis not present

## 2022-06-04 ENCOUNTER — Other Ambulatory Visit: Payer: Self-pay | Admitting: Psychiatry

## 2022-06-04 DIAGNOSIS — F319 Bipolar disorder, unspecified: Secondary | ICD-10-CM

## 2022-07-01 ENCOUNTER — Telehealth: Payer: Self-pay | Admitting: Psychiatry

## 2022-07-01 ENCOUNTER — Other Ambulatory Visit: Payer: Self-pay | Admitting: Psychiatry

## 2022-07-01 DIAGNOSIS — F319 Bipolar disorder, unspecified: Secondary | ICD-10-CM

## 2022-07-01 NOTE — Telephone Encounter (Signed)
Please order lab and I will print and fax

## 2022-07-01 NOTE — Telephone Encounter (Signed)
Christine Cisneros called today at 10:15 to request that a Lithium Lab order be sent to [facility] in [place], [place] # [phone].  She will be going in the morning to get the lab.  Please let her know it has been faxed so she will it is ok for her to go for the lab.

## 2022-07-01 NOTE — Telephone Encounter (Signed)
Signed.  Pls make sure it printed

## 2022-07-01 NOTE — Telephone Encounter (Signed)
Pt informed order faxed.

## 2022-07-02 DIAGNOSIS — R69 Illness, unspecified: Secondary | ICD-10-CM | POA: Diagnosis not present

## 2022-07-05 ENCOUNTER — Encounter: Payer: Self-pay | Admitting: Psychiatry

## 2022-07-06 DIAGNOSIS — L920 Granuloma annulare: Secondary | ICD-10-CM | POA: Diagnosis not present

## 2022-07-06 DIAGNOSIS — L858 Other specified epidermal thickening: Secondary | ICD-10-CM | POA: Diagnosis not present

## 2022-07-06 DIAGNOSIS — L718 Other rosacea: Secondary | ICD-10-CM | POA: Diagnosis not present

## 2022-09-23 DIAGNOSIS — Z23 Encounter for immunization: Secondary | ICD-10-CM | POA: Diagnosis not present

## 2022-09-23 DIAGNOSIS — J3089 Other allergic rhinitis: Secondary | ICD-10-CM | POA: Diagnosis not present

## 2022-09-23 DIAGNOSIS — I10 Essential (primary) hypertension: Secondary | ICD-10-CM | POA: Diagnosis not present

## 2022-09-30 ENCOUNTER — Encounter: Payer: Self-pay | Admitting: Psychiatry

## 2022-09-30 ENCOUNTER — Telehealth (INDEPENDENT_AMBULATORY_CARE_PROVIDER_SITE_OTHER): Payer: 59 | Admitting: Psychiatry

## 2022-09-30 DIAGNOSIS — L920 Granuloma annulare: Secondary | ICD-10-CM | POA: Diagnosis not present

## 2022-09-30 DIAGNOSIS — F319 Bipolar disorder, unspecified: Secondary | ICD-10-CM | POA: Diagnosis not present

## 2022-09-30 DIAGNOSIS — Z79899 Other long term (current) drug therapy: Secondary | ICD-10-CM | POA: Diagnosis not present

## 2022-09-30 DIAGNOSIS — L718 Other rosacea: Secondary | ICD-10-CM | POA: Diagnosis not present

## 2022-09-30 DIAGNOSIS — L218 Other seborrheic dermatitis: Secondary | ICD-10-CM | POA: Diagnosis not present

## 2022-09-30 DIAGNOSIS — R69 Illness, unspecified: Secondary | ICD-10-CM | POA: Diagnosis not present

## 2022-09-30 DIAGNOSIS — G251 Drug-induced tremor: Secondary | ICD-10-CM

## 2022-09-30 DIAGNOSIS — L570 Actinic keratosis: Secondary | ICD-10-CM | POA: Diagnosis not present

## 2022-09-30 MED ORDER — LITHIUM CARBONATE ER 300 MG PO TBCR: 900.0000 mg | EXTENDED_RELEASE_TABLET | Freq: Every day | ORAL | 1 refills | Status: DC

## 2022-09-30 MED ORDER — CARBAMAZEPINE ER 200 MG PO CP12: ORAL_CAPSULE | ORAL | 1 refills | Status: DC

## 2022-09-30 NOTE — Progress Notes (Signed)
Christine Cisneros [id number] Sep 13, 1971 51 y.o.   Virtual Visit via Telephone Note  I connected with pt by telephone and verified that I am speaking with the correct person using two identifiers.   I discussed the limitations, risks, security and privacy concerns of performing an evaluation and management service by telephone and the availability of in person appointments. I also discussed with the patient that there may be a patient responsible charge related to this service. The patient expressed understanding and agreed to proceed.  I discussed the assessment and treatment plan with the patient. The patient was provided an opportunity to ask questions and all were answered. The patient agreed with the plan and demonstrated an understanding of the instructions.   The patient was advised to call back or seek an in-person evaluation if the symptoms worsen or if the condition fails to improve as anticipated.  I provided 15 minutes of non-face-to-face time during this encounter. The call started at 1115 and ended at 1130. The patient was located at work and the provider was located office.    Subjective:   Patient ID:  Christine Cisneros is a 51 y.o. (DOB 02/03/1971) female.  Chief Complaint:  Chief Complaint  Patient presents with   Follow-up    Bipolar I disorder ([name])    HPI Christine Cisneros presents to the office today for follow-up of bipolar disorder.    when seen August 20, 2019.[name] She was doing well no meds were changed.  She had a hospitalization for 24 hours on September 18, 2019 with lithium toxicity with a level of 2.32.  The pharmacist review of her meds and supplements suggested it was related to a zinc and pumpkin seed supplement.. Lithium level on DC November 4 = 1.17.  On September 21, 2019 she would advise to resume lithium at a lower dosage of 300 mg in the morning and 600 mg in the evening on Monday Wednesday and Friday and 600 mg twice daily on the other days of the week.  It was recommended  to repeat lithium level in about 2 weeks from that date.  seen November 19, 2019.  The following was noted.  No tremor or Sx lithium toxicity and lithium 1.0.  No mood sx either on. No recent med changes. Recovered pretty well except started tremor in legs a little today.  And didn't have tremors before the toxicity.   Mood is fine overall.  But is a little anxious over the situation. Has chronic anxiety about being sick and it upset her sense of stability to have the episode of lithium toxicity.  Lithium level and BMP were ordered.  As of appointment February 18, 2020: Several lithium levels have been obtained since her last appointment.  Last lithium level was 0.7 on February 04, 2020 at 900 mg daily.  That was the same level as 2 weeks prior and no meds were changed. No problems since the last visit. Because of recent problems with lithium toxicity the following was discussed: Disc labs including CMP and elevated Alk Phos and suggest FU with PCP. Disc lithium level, CBZ level, etc. She's nervous about the lower lithium dosage with fear of mania. Consider increase CBZ to compensate for lower lithium dose. Also low dose CBZ.  She agrees to increase CBZ and wants to switch to Carbatrol bc hx double vision with higher doses CBZ IR. Carbatrol XR 400 AM and 600 PM Disc SE Check lithium at one month after last level and then repeat every 3  mos  04/11/2020 phone call patient experiencing vertigo wondering what her lithium levels were. Last lithium level received 0.6 from May 29, 2020 still on 900 mg daily with no med changes.  06/02/20 APPT with the following noted: Lab requests not being received at either lab, [facility] or [facility] when sent. No med changes since here.  Labs stable.  No mood swings.  Vertigo resolved after a few days.  No SE with either lithium 900 or CBZ ER 400 AM and 600PM.  Rare diarrhea with diet changes. Working for [name] for 12 years with same client with plan to change  client. Now changed to [name].   No med changes  11/24/20 appt noted: Parents doing well and good Xmas. Disc concern from PCP who asked about lithium.  She then had another doctor question her use of lithium but without any specific reason.  Wonders if she should continue lithium.  Discussed the doubt that that created.  05/28/21 appt noted: M knee replacement and helping her. No problems with meds or SE.  No mood swings. Has PCP checking other labs. Plan: Continue current meds, lithium 900 mg q. at bedtime, carbamazepine XR 600 mg every morning and 900 mg nightly.  10/01/2021 appointment with the following noted: Off lisinopril bc Cough.  Now on metoprolol BID working well. Still doing great without mood swings.  Sleep is fine.   PCP [name].  Really good.  No concerns expressed over lithium. SE none except occ metallic taste.  Was worse after lithium toxicity. No vertigo. Son Christine Cisneros working on [employer] and parents doing well. Patient reports stable mood and denies depressed or irritable moods.  No mania.  Patient denies any recent difficulty with anxiety.  Patient denies difficulty with sleep initiation or maintenance. Denies appetite disturbance.  Patient reports that energy and motivation have been good.  Patient denies any difficulty with concentration.  Patient denies any suicidal ideation.  04/01/22 appt noted: Recovered well with Covid. Still doing well. Patient reports stable mood and denies depressed or irritable moods.  Patient denies any recent difficulty with anxiety.  Patient denies difficulty with sleep initiation or maintenance. Denies appetite disturbance.  Patient reports that energy and motivation have been good.  Patient denies any difficulty with concentration.  Patient denies any suicidal ideation. Lithium 0.7 and no SE including no tremor. On 900 mg daily  09/30/22 appt noted: [phone] [place] fax Doing well.  Working [employer].  No SE.  Son now  Academic librarian and moving along. Patient reports stable mood and denies depressed or irritable moods.  Patient denies any recent difficulty with anxiety.  Patient denies difficulty with sleep initiation or maintenance. Denies appetite disturbance.  Patient reports that energy and motivation have been good.  Patient denies any difficulty with concentration.  Patient denies any suicidal ideation. Still lithium CR 300 AM and 600 PM, Carbatrol 200 mg 3 in AM, 2 PM  Changing PCP to [facility].  Past Psychiatric Medication Trials: Lithium 1200 daily, carbamazepine 400 twice daily, Depakote weight gain and tired,  olanzapine, Geodon no response, Abilify 15,  Ambien  Review of Systems:  Review of Systems  Cardiovascular:  Negative for palpitations.  Gastrointestinal:  Negative for diarrhea.  Neurological:  Negative for tremors.    Medications: I have reviewed the patient's current medications.  Current Outpatient Medications  Medication Sig Dispense Refill   calcium carbonate 200 MG capsule Take 600 mg by mouth daily.      carbamazepine (CARBATROL)  200 MG 12 hr capsule TAKE 2 CAPSULES IN THE MORNING AND 3 CAPSULES IN  THE EVENING (Patient taking differently: 3 in AM 2 HS) 450 capsule 1   cholecalciferol (VITAMIN D) 1000 UNITS tablet Take 5,000 Units by mouth daily.      Chromium 200 MCG TABS Take by mouth.     lithium carbonate (LITHOBID) 300 MG CR tablet TAKE 3 TABLETS DAILY 270 tablet 1   metoprolol tartrate (LOPRESSOR) 100 MG tablet TAKE 1 TABLET (100 MG TOTAL) BY MOUTH 2 (TWO) TIMES DAILY. NEEDS APPOINTMENT (Patient taking differently: Take 100 mg by mouth 2 (two) times daily. NEEDS APPOINTMENT of ER) 60 tablet 0   Multiple Vitamin (MULTIVITAMIN) tablet Take 1 tablet by mouth daily.     No current facility-administered medications for this visit.    Medication Side Effects: None  Allergies:  Allergies  Allergen Reactions   Norvasc [Amlodipine Besylate] Shortness Of Breath and Nausea  And Vomiting    chest tightness    Past Medical History:  Diagnosis Date   Bipolar 1 disorder ([place])    Hypertension     Family History  Problem Relation Age of Onset   Cancer Father        melanoma   Hyperlipidemia Father    Diabetes Maternal Grandfather    Diabetes Paternal Grandmother     Social History   Socioeconomic History   Marital status: Single    Spouse name: Not on file   Number of children: Not on file   Years of education: Not on file   Highest education level: Not on file  Occupational History   Occupation: RN  Tobacco Use   Smoking status: Never   Smokeless tobacco: Never  Substance and Sexual Activity   Alcohol use: No    Alcohol/week: 0.0 standard drinks of alcohol   Drug use: No   Sexual activity: Yes    Partners: Male  Other Topics Concern   Not on file  Social History Narrative   Not on file   Social Determinants of Health   Financial Resource Strain: Not on file  Food Insecurity: Not on file  Transportation Needs: Not on file  Physical Activity: Not on file  Stress: Not on file  Social Connections: Not on file  Intimate Partner Violence: Not on file    Past Medical History, Surgical history, Social history, and Family history were reviewed and updated as appropriate.   Please see review of systems for further details on the patient's review from today.   Objective:   Physical Exam:  There were no vitals taken for this visit.  Physical Exam Neurological:     Mental Status: She is alert and oriented to person, place, and time.     Cranial Nerves: No dysarthria.  Psychiatric:        Attention and Perception: Attention and perception normal.        Mood and Affect: Mood normal.        Speech: Speech normal.        Behavior: Behavior is cooperative.        Thought Content: Thought content normal. Thought content is not paranoid or delusional. Thought content does not include homicidal or suicidal ideation. Thought content does  not include homicidal or suicidal plan.        Cognition and Memory: Cognition and memory normal.        Judgment: Judgment normal.     Comments: Insight intact     Lab Review:  Component Value Date/Time   NA 137 10/05/2019 0825   K 5.4 (H) 10/05/2019 0825   CL 102 10/05/2019 0825   CO2 26 10/05/2019 0825   GLUCOSE 100 (H) 10/05/2019 0825   BUN 16 10/05/2019 0825   CREATININE 0.91 10/05/2019 0825   CALCIUM 10.1 10/05/2019 0825   PROT 8.2 (H) 09/18/2019 1551   ALBUMIN 4.7 09/18/2019 1551   AST 22 09/18/2019 1551   ALT 40 09/18/2019 1551   ALKPHOS 145 (H) 09/18/2019 1551   BILITOT 0.4 09/18/2019 1551   GFRNONAA >60 09/19/2019 1121   GFRNONAA 79 06/01/2019 1545   GFRAA >60 09/19/2019 1121   GFRAA 91 06/01/2019 1545       Component Value Date/Time   WBC 10.6 (H) 09/18/2019 1551   RBC 3.87 09/18/2019 1551   HGB 12.4 09/18/2019 1551   HCT 37.0 09/18/2019 1551   PLT 340 09/18/2019 1551   MCV 95.6 09/18/2019 1551   MCH 32.0 09/18/2019 1551   MCHC 33.5 09/18/2019 1551   RDW 12.1 09/18/2019 1551   LYMPHSABS 1.0 09/18/2019 1551   MONOABS 0.6 09/18/2019 1551   EOSABS 0.1 09/18/2019 1551   BASOSABS 0.0 09/18/2019 1551    Lithium  Date Value Ref Range Status  08/02/2016 0.7  Final   Lithium Lvl  Date Value Ref Range Status  07/23/2021 0.6 0.5 - 1.2 mmol/L Final    Comment:    Plasma concentration of 0.5 - 0.8 mmol/L are advised for long-term use; concentrations of up to 1.2 mmol/L may be necessary during acute treatment.                                  Detection Limit = 0.1                           <0.1 indicates None Detected   March 03, 2020 lithium level 0.6 on 900 mg daily.  No med changes 07/02/22 lithium 0.7 on 900 mg daily Lab Results  Component Value Date   CBMZ 4.0 09/19/2019     .res Assessment: Plan:    Bipolar I disorder ([place]) - Plan: Lithium level  Lithium use - Plan: Lithium level  Lithium-induced tremor    lithium toxicity  09/2019.  Her bipolar disorder has been relatively stable for several years.  She had a couple of severe manic episodes around 2006 and had to be hospitalized for 1 of them.  lithium toxicity 09/2019.  Lithium dose reduced from 1200 to 900 mg daily. Mood has been stable since reduction in lithium dose.  Doing well with CBZ and lithium until the lithium toxicity September 18 2019 with hospital stay.. Mood is stable.  She is on a lower dose of lithium but has a good blood level of 0.6 and that is been consistent over the last couple of checks.  Counseled patient regarding potential benefits, risks, and side effects of lithium to include potential risk of lithium affecting thyroid and renal function.  Discussed need for periodic lab monitoring to determine drug level and to assess for potential adverse effects.  Counseled patient regarding signs and symptoms of lithium toxicity and advised that they notify office immediately or seek urgent medical attention if experiencing these signs and symptoms.  Patient advised to contact office with any questions or concerns.  Discussed that some blood pressure meds can affect lithium levels and cause him to  go up.  Discussed the type of blood pressure meds she can use and there potential effects on lithium levels which can be monitored.  The most important type to avoid would be the thiazide diuretics.  We also discussed nonsteroidal anti-inflammatories and their risks with lithium. Rare lithium tremor resolved.   Disc lithium level, CBZ level, etc. She's nervous about the lower lithium dosage with fear of mania. Mania has not recurred with less lithium with levels in low normal range Consider increase CBZ to compensate for lower lithium dose if needed. Also low dose CBZ.  Stable mood. Carbatrol XR 400 AM and 600 PM Disc SE .  Vertigo resolved with current carbamazepine dosing.  Check lithium at one month after last level and then repeat every 3 mos 07/23/21 lithium  0.6 stable. Normal CMP and TSH 12/2020 03/2022 Lithium 0.7 and no SE including no tremor. On 900 mg daily 01/29/22 normal BMP and CA 9.8 and TSH 07/02/22 lithium 0.7 on 900 mg daily  Disc in detail risk factors for lithium toxicity: Gave her a copy previously and discussed the contents of the article by Dr. Herbie Baltimore post published in 2018 on the underutilization of lithium.  Her primary care doctor has been critical of her being on lithium even though she has no adverse effects.  Educated her about the long-term benefits of lithium as opposed to other mood stabilizers and that in her case the only alternative would be atypical antipsychotics.  Discussed long-term side effect risks of those medications as well.  She elects to continue the lithium as it is my recommendation.  She wants to avoid the risk of manic psychosis that she experienced years ago.  FU 6 mos  Lynder Parents, MD, DFAPA  Please see After Visit Summary for patient specific instructions.  No future appointments.   Orders Placed This Encounter  Procedures   Lithium level      -------------------------------

## 2022-10-12 DIAGNOSIS — R69 Illness, unspecified: Secondary | ICD-10-CM | POA: Diagnosis not present

## 2022-10-12 DIAGNOSIS — Z79899 Other long term (current) drug therapy: Secondary | ICD-10-CM | POA: Diagnosis not present

## 2022-10-13 LAB — LITHIUM LEVEL: Lithium Lvl: 0.7 mmol/L (ref 0.5–1.2)

## 2022-12-09 ENCOUNTER — Telehealth: Payer: Self-pay | Admitting: Psychiatry

## 2022-12-09 ENCOUNTER — Other Ambulatory Visit: Payer: Self-pay | Admitting: Psychiatry

## 2022-12-09 MED ORDER — ZOLPIDEM TARTRATE 10 MG PO TABS: 10.0000 mg | ORAL_TABLET | Freq: Every evening | ORAL | 0 refills | Status: DC | PRN

## 2022-12-09 NOTE — Telephone Encounter (Signed)
Please review

## 2022-12-09 NOTE — Telephone Encounter (Signed)
Pt is going to [place] in march and needs 20 ambien to help her sleep since she will be in a different time zone. Pharmacy is [facility] on [address] in [place]

## 2023-01-27 ENCOUNTER — Other Ambulatory Visit: Payer: Self-pay | Admitting: Psychiatry

## 2023-01-27 ENCOUNTER — Other Ambulatory Visit: Payer: Self-pay

## 2023-01-27 DIAGNOSIS — Z79899 Other long term (current) drug therapy: Secondary | ICD-10-CM

## 2023-01-27 DIAGNOSIS — F319 Bipolar disorder, unspecified: Secondary | ICD-10-CM | POA: Diagnosis not present

## 2023-01-29 LAB — LITHIUM LEVEL: Lithium Lvl: 0.7 mmol/L (ref 0.5–1.2)

## 2023-04-06 ENCOUNTER — Encounter: Payer: Self-pay | Admitting: Psychiatry

## 2023-04-06 ENCOUNTER — Telehealth (INDEPENDENT_AMBULATORY_CARE_PROVIDER_SITE_OTHER): Payer: 59 | Admitting: Psychiatry

## 2023-04-06 DIAGNOSIS — G251 Drug-induced tremor: Secondary | ICD-10-CM

## 2023-04-06 DIAGNOSIS — F319 Bipolar disorder, unspecified: Secondary | ICD-10-CM

## 2023-04-06 DIAGNOSIS — Z79899 Other long term (current) drug therapy: Secondary | ICD-10-CM | POA: Diagnosis not present

## 2023-04-06 MED ORDER — LITHIUM CARBONATE ER 300 MG PO TBCR: 900.0000 mg | EXTENDED_RELEASE_TABLET | Freq: Every day | ORAL | 1 refills | Status: DC

## 2023-04-06 MED ORDER — CARBAMAZEPINE ER 200 MG PO CP12: ORAL_CAPSULE | ORAL | 1 refills | Status: DC

## 2023-04-06 NOTE — Progress Notes (Signed)
Christine Cisneros [id number] 1971-03-05 52 y.o.   Virtual Visit via Telephone Note  I connected with pt by telephone and verified that I am speaking with the correct person using two identifiers.   I discussed the limitations, risks, security and privacy concerns of performing an evaluation and management service by telephone and the availability of in person appointments. I also discussed with the patient that there may be a patient responsible charge related to this service. The patient expressed understanding and agreed to proceed.  I discussed the assessment and treatment plan with the patient. The patient was provided an opportunity to ask questions and all were answered. The patient agreed with the plan and demonstrated an understanding of the instructions.   The patient was advised to call back or seek an in-person evaluation if the symptoms worsen or if the condition fails to improve as anticipated.  I provided 15 minutes of non-face-to-face time during this encounter.  The patient was located at work and the provider was located office. From 1115-1130 am   Subjective:   Patient ID:  Christine Cisneros is a 52 y.o. (DOB 1971-05-06) female.  Chief Complaint:  Chief Complaint  Patient presents with   Follow-up    HPI Christine Cisneros presents to the office today for follow-up of bipolar disorder.    when seen August 20, 2019.[name] She was doing well no meds were changed.  She had a hospitalization for 24 hours on September 18, 2019 with lithium toxicity with a level of 2.32.  The pharmacist review of her meds and supplements suggested it was related to a zinc and pumpkin seed supplement.. Lithium level on DC November 4 = 1.17.  On September 21, 2019 she would advise to resume lithium at a lower dosage of 300 mg in the morning and 600 mg in the evening on Monday Wednesday and Friday and 600 mg twice daily on the other days of the week.  It was recommended to repeat lithium level in about 2 weeks from that  date.  seen November 19, 2019.  The following was noted.  No tremor or Sx lithium toxicity and lithium 1.0.  No mood sx either on. No recent med changes. Recovered pretty well except started tremor in legs a little today.  And didn't have tremors before the toxicity.   Mood is fine overall.  But is a little anxious over the situation. Has chronic anxiety about being sick and it upset her sense of stability to have the episode of lithium toxicity.  Lithium level and BMP were ordered.  As of appointment February 18, 2020: Several lithium levels have been obtained since her last appointment.  Last lithium level was 0.7 on February 04, 2020 at 900 mg daily.  That was the same level as 2 weeks prior and no meds were changed. No problems since the last visit. Because of recent problems with lithium toxicity the following was discussed: Disc labs including CMP and elevated Alk Phos and suggest FU with PCP. Disc lithium level, CBZ level, etc. She's nervous about the lower lithium dosage with fear of mania. Consider increase CBZ to compensate for lower lithium dose. Also low dose CBZ.  She agrees to increase CBZ and wants to switch to Carbatrol bc hx double vision with higher doses CBZ IR. Carbatrol XR 400 AM and 600 PM Disc SE Check lithium at one month after last level and then repeat every 3 mos  04/11/2020 phone call patient experiencing vertigo wondering what her lithium levels  were. Last lithium level received 0.6 from May 29, 2020 still on 900 mg daily with no med changes.  06/02/20 APPT with the following noted: Lab requests not being received at either lab, [facility] or [facility] when sent. No med changes since here.  Labs stable.  No mood swings.  Vertigo resolved after a few days.  No SE with either lithium 900 or CBZ ER 400 AM and 600PM.  Rare diarrhea with diet changes. 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 Now changed to [facility].   No med  changes  11/24/20 appt noted: Parents doing well and good Xmas. Disc concern from PCP who asked about lithium.  She then had another doctor question her use of lithium but without any specific reason.  Wonders if she should continue lithium.  Discussed the doubt that that created.  05/28/21 appt noted: M knee replacement and helping her. No problems with meds or SE.  No mood swings. Has PCP checking other labs. Plan: Continue current meds, lithium 900 mg q. at bedtime, carbamazepine XR 600 mg every morning and 900 mg nightly.  10/01/2021 appointment with the following noted: Off lisinopril bc Cough.  Now on metoprolol BID working well. Still doing great without mood swings.  Sleep is fine.   PCP Dr. Vernie Ammons.  Really good.  No concerns expressed over lithium. SE none except occ metallic taste.  Was worse after lithium toxicity. No vertigo. Son Christin Fudge working on Education officer, community and parents doing well. Patient reports stable mood and denies depressed or irritable moods.  No mania.  Patient denies any recent difficulty with anxiety.  Patient denies difficulty with sleep initiation or maintenance. Denies appetite disturbance.  Patient reports that energy and motivation have been good.  Patient denies any difficulty with concentration.  Patient denies any suicidal ideation.  04/01/22 appt noted: Recovered well with Covid. Still doing well. Patient reports stable mood and denies depressed or irritable moods.  Patient denies any recent difficulty with anxiety.  Patient denies difficulty with sleep initiation or maintenance. Denies appetite disturbance.  Patient reports that energy and motivation have been good.  Patient denies any difficulty with concentration.  Patient denies any suicidal ideation. Lithium 0.7 and no SE including no tremor. On 900 mg daily  09/30/22 appt noted: [phone] [facility] [place] fax Doing well.  Working [employer].  No SE.  Son now Artist and moving along. Patient  reports stable mood and denies depressed or irritable moods.  Patient denies any recent difficulty with anxiety.  Patient denies difficulty with sleep initiation or maintenance. Denies appetite disturbance.  Patient reports that energy and motivation have been good.  Patient denies any difficulty with concentration.  Patient denies any suicidal ideation. Still lithium CR 300 AM and 600 PM, Carbatrol 200 mg 3 in AM, 2 PM  04/06/23 appt :  Still lithium CR 300 AM and 600 PM, Carbatrol 200 mg 3 in AM, 2 PM, Ambien 10 HS prn Consistent with meds  Doing great.  No mood swings. No SE No complaints. Patient reports stable mood and denies depressed or irritable moods.  Patient denies any recent difficulty with anxiety.  Patient denies difficulty with sleep initiation or maintenance. Denies appetite disturbance.  Patient reports that energy and motivation have been good.  Patient denies any difficulty with concentration.  Patient denies any suicidal ideation. PE June 5.  Changing PCP to [facility].  Past Psychiatric Medication Trials: Lithium 1200 daily, carbamazepine 400  twice daily, Depakote weight gain and tired,  olanzapine, Geodon no response, Abilify 15,  Ambien  Review of Systems:  Review of Systems  Cardiovascular:  Negative for palpitations.  Gastrointestinal:  Negative for diarrhea.  Neurological:  Negative for tremors.    Medications: I have reviewed the patient's current medications.  Current Outpatient Medications  Medication Sig Dispense Refill   calcium carbonate 200 MG capsule Take 600 mg by mouth daily.      cholecalciferol (VITAMIN D) 1000 UNITS tablet Take 5,000 Units by mouth daily.      Chromium 200 MCG TABS Take by mouth.     metoprolol tartrate (LOPRESSOR) 100 MG tablet TAKE 1 TABLET (100 MG TOTAL) BY MOUTH 2 (TWO) TIMES DAILY. NEEDS APPOINTMENT (Patient taking differently: Take 100 mg by mouth 2 (two) times daily. NEEDS APPOINTMENT of ER) 60 tablet 0   Multiple Vitamin  (MULTIVITAMIN) tablet Take 1 tablet by mouth daily.     zolpidem (AMBIEN) 10 MG tablet Take 1 tablet (10 mg total) by mouth at bedtime as needed for sleep. 30 tablet 0   carbamazepine (CARBATROL) 200 MG 12 hr capsule TAKE 2 CAPSULES IN THE MORNING AND 3 CAPSULES IN  THE EVENING 450 capsule 1   lithium carbonate (LITHOBID) 300 MG ER tablet Take 3 tablets (900 mg total) by mouth daily. 270 tablet 1   No current facility-administered medications for this visit.    Medication Side Effects: None  Allergies:  Allergies  Allergen Reactions   Norvasc [Amlodipine Besylate] Shortness Of Breath and Nausea And Vomiting    chest tightness    Past Medical History:  Diagnosis Date   Bipolar 1 disorder (HCC)    Hypertension     Family History  Problem Relation Age of Onset   Cancer Father        melanoma   Hyperlipidemia Father    Diabetes Maternal Grandfather    Diabetes Paternal Grandmother     Social History   Socioeconomic History   Marital status: Single    Spouse name: Not on file   Number of children: Not on file   Years of education: Not on file   Highest education level: Not on file  Occupational History   Occupation: RN  Tobacco Use   Smoking status: Never   Smokeless tobacco: Never  Substance and Sexual Activity   Alcohol use: No    Alcohol/week: 0.0 standard drinks of alcohol   Drug use: No   Sexual activity: Yes    Partners: Male  Other Topics Concern   Not on file  Social History Narrative   Not on file   Social Determinants of Health   Financial Resource Strain: Not on file  Food Insecurity: Not on file  Transportation Needs: Not on file  Physical Activity: Not on file  Stress: Not on file  Social Connections: Not on file  Intimate Partner Violence: Not on file    Past Medical History, Surgical history, Social history, and Family history were reviewed and updated as appropriate.   Please see review of systems for further details on the patient's  review from today.   Objective:   Physical Exam:  There were no vitals taken for this visit.  Physical Exam Neurological:     Mental Status: She is alert and oriented to person, place, and time.     Cranial Nerves: No dysarthria.  Psychiatric:        Attention and Perception: Attention and perception normal.  Mood and Affect: Mood normal.        Speech: Speech normal.        Behavior: Behavior is cooperative.        Thought Content: Thought content normal. Thought content is not paranoid or delusional. Thought content does not include homicidal or suicidal ideation. Thought content does not include homicidal or suicidal plan.        Cognition and Memory: Cognition and memory normal.        Judgment: Judgment normal.     Comments: Insight intact     Lab Review:     Component Value Date/Time   NA 137 10/05/2019 0825   K 5.4 (H) 10/05/2019 0825   CL 102 10/05/2019 0825   CO2 26 10/05/2019 0825   GLUCOSE 100 (H) 10/05/2019 0825   BUN 16 10/05/2019 0825   CREATININE 0.91 10/05/2019 0825   CALCIUM 10.1 10/05/2019 0825   PROT 8.2 (H) 09/18/2019 1551   ALBUMIN 4.7 09/18/2019 1551   AST 22 09/18/2019 1551   ALT 40 09/18/2019 1551   ALKPHOS 145 (H) 09/18/2019 1551   BILITOT 0.4 09/18/2019 1551   GFRNONAA >60 09/19/2019 1121   GFRNONAA 79 06/01/2019 1545   GFRAA >60 09/19/2019 1121   GFRAA 91 06/01/2019 1545       Component Value Date/Time   WBC 10.6 (H) 09/18/2019 1551   RBC 3.87 09/18/2019 1551   HGB 12.4 09/18/2019 1551   HCT 37.0 09/18/2019 1551   PLT 340 09/18/2019 1551   MCV 95.6 09/18/2019 1551   MCH 32.0 09/18/2019 1551   MCHC 33.5 09/18/2019 1551   RDW 12.1 09/18/2019 1551   LYMPHSABS 1.0 09/18/2019 1551   MONOABS 0.6 09/18/2019 1551   EOSABS 0.1 09/18/2019 1551   BASOSABS 0.0 09/18/2019 1551    Lithium  Date Value Ref Range Status  08/02/2016 0.7  Final   Lithium Lvl  Date Value Ref Range Status  01/27/2023 0.7 0.5 - 1.2 mmol/L Final     Comment:    A concentration of 0.5-0.8 mmol/L is advised for long-term use; concentrations of up to 1.2 mmol/L may be necessary during acute treatment.                                  Detection Limit = 0.1                           <0.1 indicates None Detected   Lithium 0.7 @3 /14/24 on 900 mg HS  March 03, 2020 lithium level 0.6 on 900 mg daily.  No med changes 07/02/22 lithium 0.7 on 900 mg daily Lab Results  Component Value Date   CBMZ 4.0 09/19/2019     .res Assessment: Plan:    Bipolar I disorder (HCC) - Plan: Lithium level, lithium carbonate (LITHOBID) 300 MG ER tablet, carbamazepine (CARBATROL) 200 MG 12 hr capsule, Basic metabolic panel, TSH, Lithium level  Lithium use - Plan: Basic metabolic panel, TSH, Lithium level  Lithium-induced tremor    lithium toxicity 09/2019.  Her bipolar disorder has been relatively stable for several years.  She had a couple of severe manic episodes around 2006 and had to be hospitalized for 1 of them.  lithium toxicity 09/2019.  Lithium dose reduced from 1200 to 900 mg daily. Mood has been stable since reduction in lithium dose.  Doing well with CBZ and lithium until the  lithium toxicity September 18 2019 with hospital stay.. Mood is stable.  She is on a lower dose of lithium but has a good blood level of 0.6 and that is been consistent over the last couple of checks.  Counseled patient regarding potential benefits, risks, and side effects of lithium to include potential risk of lithium affecting thyroid and renal function.  Discussed need for periodic lab monitoring to determine drug level and to assess for potential adverse effects.  Counseled patient regarding signs and symptoms of lithium toxicity and advised that they notify office immediately or seek urgent medical attention if experiencing these signs and symptoms.  Patient advised to contact office with any questions or concerns.  Discussed that some blood pressure meds can affect lithium  levels and cause him to go up.  Discussed the type of blood pressure meds she can use and there potential effects on lithium levels which can be monitored.  The most important type to avoid would be the thiazide diuretics.  We also discussed nonsteroidal anti-inflammatories and their risks with lithium. Rare lithium tremor resolved.   Disc lithium level, CBZ level, etc. She's nervous about the lower lithium dosage with fear of mania. Mania has not recurred with less lithium with levels in low normal range Consider increase CBZ to compensate for lower lithium dose if needed. Also low dose CBZ.  Stable mood. Carbatrol XR 400 AM and 600 PM Disc SE .  Vertigo resolved with current carbamazepine dosing.  Check lithium at one month after last level and then repeat every 3 mos [phone] [facility] [place] fax.  Sent order. Lithium 0.7 @3 /14/24 on 900 mg HS 07/23/21 lithium 0.6 stable. Normal CMP and TSH 12/2020 03/2022 Lithium 0.7 and no SE including no tremor. On 900 mg daily 01/29/22 normal BMP and CA 9.8 and TSH 07/02/22 lithium 0.7 on 900 mg daily  Disc in detail risk factors for lithium toxicity: Gave her a copy previously and discussed the contents of the article by Dr. Molly Maduro post published in 2018 on the underutilization of lithium.  Her primary care doctor has been critical of her being on lithium even though she has no adverse effects.  Educated her about the long-term benefits of lithium as opposed to other mood stabilizers and that in her case the only alternative would be atypical antipsychotics.  Discussed long-term side effect risks of those medications as well.  She elects to continue the lithium as it is my recommendation.  She wants to avoid the risk of manic psychosis that she experienced years ago so doesn't want any med changes.   FU 6 mos  Meredith Staggers, MD, DFAPA  Please see After Visit Summary for patient specific instructions.  No future appointments.   Orders Placed This  Encounter  Procedures   Lithium level   Basic metabolic panel   TSH   Lithium level      -------------------------------

## 2023-04-07 ENCOUNTER — Telehealth: Payer: 59 | Admitting: Psychiatry

## 2023-04-20 DIAGNOSIS — Z0000 Encounter for general adult medical examination without abnormal findings: Secondary | ICD-10-CM | POA: Diagnosis not present

## 2023-04-20 DIAGNOSIS — Z13220 Encounter for screening for lipoid disorders: Secondary | ICD-10-CM | POA: Diagnosis not present

## 2023-05-04 DIAGNOSIS — L738 Other specified follicular disorders: Secondary | ICD-10-CM | POA: Diagnosis not present

## 2023-05-04 DIAGNOSIS — D2330 Other benign neoplasm of skin of unspecified part of face: Secondary | ICD-10-CM | POA: Diagnosis not present

## 2023-05-10 DIAGNOSIS — Z1231 Encounter for screening mammogram for malignant neoplasm of breast: Secondary | ICD-10-CM | POA: Diagnosis not present

## 2023-05-10 DIAGNOSIS — Z0000 Encounter for general adult medical examination without abnormal findings: Secondary | ICD-10-CM | POA: Diagnosis not present

## 2023-05-10 DIAGNOSIS — J3089 Other allergic rhinitis: Secondary | ICD-10-CM | POA: Diagnosis not present

## 2023-05-10 DIAGNOSIS — F319 Bipolar disorder, unspecified: Secondary | ICD-10-CM | POA: Diagnosis not present

## 2023-05-10 DIAGNOSIS — Z13220 Encounter for screening for lipoid disorders: Secondary | ICD-10-CM | POA: Diagnosis not present

## 2023-05-17 ENCOUNTER — Encounter: Payer: Self-pay | Admitting: Psychiatry

## 2023-05-26 DIAGNOSIS — F319 Bipolar disorder, unspecified: Secondary | ICD-10-CM | POA: Diagnosis not present

## 2023-05-26 DIAGNOSIS — Z79899 Other long term (current) drug therapy: Secondary | ICD-10-CM | POA: Diagnosis not present

## 2023-05-27 LAB — TSH: TSH: 1.63 u[IU]/mL (ref 0.450–4.500)

## 2023-05-27 LAB — BASIC METABOLIC PANEL
BUN/Creatinine Ratio: 15 (ref 9–23)
BUN: 11 mg/dL (ref 6–24)
CO2: 23 mmol/L (ref 20–29)
Calcium: 9.8 mg/dL (ref 8.7–10.2)
Chloride: 101 mmol/L (ref 96–106)
Creatinine, Ser: 0.74 mg/dL (ref 0.57–1.00)
Glucose: 147 mg/dL — ABNORMAL HIGH (ref 70–99)
Potassium: 4.5 mmol/L (ref 3.5–5.2)
Sodium: 139 mmol/L (ref 134–144)
eGFR: 97 mL/min/{1.73_m2} (ref >59)

## 2023-05-27 LAB — LITHIUM LEVEL: Lithium Lvl: 0.7 mmol/L (ref 0.5–1.2)

## 2023-05-30 ENCOUNTER — Other Ambulatory Visit: Payer: Self-pay | Admitting: Psychiatry

## 2023-05-30 DIAGNOSIS — F319 Bipolar disorder, unspecified: Secondary | ICD-10-CM

## 2023-06-09 DIAGNOSIS — Z1231 Encounter for screening mammogram for malignant neoplasm of breast: Secondary | ICD-10-CM | POA: Diagnosis not present

## 2023-09-05 DIAGNOSIS — F319 Bipolar disorder, unspecified: Secondary | ICD-10-CM | POA: Diagnosis not present

## 2023-09-06 LAB — LITHIUM LEVEL: Lithium Lvl: 0.7 mmol/L (ref 0.5–1.2)

## 2023-09-22 ENCOUNTER — Ambulatory Visit: Payer: 59 | Admitting: Psychiatry

## 2023-09-29 ENCOUNTER — Ambulatory Visit: Payer: 59 | Admitting: Psychiatry

## 2023-11-19 ENCOUNTER — Other Ambulatory Visit: Payer: Self-pay | Admitting: Psychiatry

## 2023-11-19 DIAGNOSIS — F319 Bipolar disorder, unspecified: Secondary | ICD-10-CM

## 2023-11-21 NOTE — Telephone Encounter (Signed)
 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 She has canceled 3 appts in a row. Appt next Monday, filled enough to get her to her next appt.

## 2023-11-28 ENCOUNTER — Ambulatory Visit: Payer: 59 | Admitting: Psychiatry

## 2023-11-28 ENCOUNTER — Encounter: Payer: Self-pay | Admitting: Psychiatry

## 2023-11-28 DIAGNOSIS — F319 Bipolar disorder, unspecified: Secondary | ICD-10-CM | POA: Diagnosis not present

## 2023-11-28 DIAGNOSIS — Z79899 Other long term (current) drug therapy: Secondary | ICD-10-CM

## 2023-11-28 DIAGNOSIS — G251 Drug-induced tremor: Secondary | ICD-10-CM

## 2023-11-28 MED ORDER — CARBAMAZEPINE ER 200 MG PO CP12
ORAL_CAPSULE | ORAL | 1 refills | Status: DC
Start: 2023-11-28 — End: 2024-05-10

## 2023-11-28 MED ORDER — LITHIUM CARBONATE ER 300 MG PO TBCR
900.0000 mg | EXTENDED_RELEASE_TABLET | Freq: Every day | ORAL | 1 refills | Status: DC
Start: 2023-11-28 — End: 2024-05-10

## 2023-11-28 MED ORDER — ZOLPIDEM TARTRATE 10 MG PO TABS: 10.0000 mg | ORAL_TABLET | Freq: Every evening | ORAL | 0 refills | Status: DC | PRN

## 2023-11-28 NOTE — Progress Notes (Signed)
 Christine Cisneros [id number] January 14, 1971 53 y.o.    Subjective:   Patient ID:  Christine Cisneros is a 53 y.o. (DOB 05/07/1971) female.  Chief Complaint:  Chief Complaint  Patient presents with   Follow-up    HPI Christine Cisneros presents to the office today for follow-up of bipolar disorder.    when seen August 20, 2019.Christine Cisneros She was doing well no meds were changed.  She had a hospitalization for 24 hours on September 18, 2019 with lithium  toxicity with a level of 2.32.  The pharmacist review of her meds and supplements suggested it was related to a zinc and pumpkin seed supplement.. Lithium  level on DC November 4 = 1.17.  On September 21, 2019 she would advise to resume lithium  at a lower dosage of 300 mg in the morning and 600 mg in the evening on Monday Wednesday and Friday and 600 mg twice daily on the other days of the week.  It was recommended to repeat lithium  level in about 2 weeks from that date.  seen November 19, 2019.  The following was noted.  No tremor or Sx lithium  toxicity and lithium  1.0.  No mood sx either on. No recent med changes. Recovered pretty well except started tremor in legs a little today.  And didn't have tremors before the toxicity.   Mood is fine overall.  But is a little anxious over the situation. Has chronic anxiety about being sick and it upset her sense of stability to have the episode of lithium  toxicity.  Lithium  level and BMP were ordered.  As of appointment February 18, 2020: Several lithium  levels have been obtained since her last appointment.  Last lithium  level was 0.7 on February 04, 2020 at 900 mg daily.  That was the same level as 2 weeks prior and no meds were changed. No problems since the last visit. Because of recent problems with lithium  toxicity the following was discussed: Disc labs including CMP and elevated Alk Phos and suggest FU with PCP. Disc lithium  level, CBZ level, etc. She's nervous about the lower lithium  dosage with fear of mania. Consider increase CBZ to  compensate for lower lithium  dose. Also low dose CBZ.  She agrees to increase CBZ and wants to switch to Carbatrol  bc hx double vision with higher doses CBZ IR. Carbatrol  XR 400 AM and 600 PM Disc SE Check lithium  at one month after last level and then repeat every 3 mos  04/11/2020 phone call patient experiencing vertigo wondering what her lithium  levels were. Last lithium  level received 0.6 from May 29, 2020 still on 900 mg daily with no med changes.  06/02/20 APPT with the following noted: Lab requests not being received at either lab, [facility] or [facility] when sent. No med changes since here.  Labs stable.  No mood swings.  Vertigo resolved after a few days.  No SE with either lithium  900 or CBZ ER 400 AM and 600PM.  Rare diarrhea with diet changes. 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 Now changed to Christine Cisneros.   No med changes  11/24/20 appt noted: Parents doing well and good Xmas. Disc concern from PCP who asked about lithium .  She then had another doctor question her use of lithium  but without any specific reason.  Wonders if she should continue lithium .  Discussed the doubt that that created.  05/28/21 appt noted: M knee replacement and helping her. No problems with meds or SE.  No mood  swings. Has PCP checking other labs. Plan: Continue current meds, lithium  900 mg q. at bedtime, carbamazepine  XR 600 mg every morning and 900 mg nightly.  10/01/2021 appointment with the following noted: Off lisinopril  bc Cough.  Now on metoprolol  BID working well. Still doing great without mood swings.  Sleep is fine.   PCP Dr. Ernestine.  Really good.  No concerns expressed over lithium . SE none except occ metallic taste.  Was worse after lithium  toxicity. No vertigo. Son Christine Cisneros working on education officer, community and parents doing well. Patient reports stable mood and denies depressed or irritable moods.  No mania.  Patient denies any recent difficulty with  anxiety.  Patient denies difficulty with sleep initiation or maintenance. Denies appetite disturbance.  Patient reports that energy and motivation have been good.  Patient denies any difficulty with concentration.  Patient denies any suicidal ideation.  04/01/22 appt noted: Recovered well with Covid. Still doing well. Patient reports stable mood and denies depressed or irritable moods.  Patient denies any recent difficulty with anxiety.  Patient denies difficulty with sleep initiation or maintenance. Denies appetite disturbance.  Patient reports that energy and motivation have been good.  Patient denies any difficulty with concentration.  Patient denies any suicidal ideation. Lithium  0.7 and no SE including no tremor. On 900 mg daily  09/30/22 appt noted: [phone] [facility] [place] fax Doing well.  Working [employer] Cisneros.  No SE.  Son now artist and moving along. Patient reports stable mood and denies depressed or irritable moods.  Patient denies any recent difficulty with anxiety.  Patient denies difficulty with sleep initiation or maintenance. Denies appetite disturbance.  Patient reports that energy and motivation have been good.  Patient denies any difficulty with concentration.  Patient denies any suicidal ideation. Still lithium  CR 300 AM and 600 PM, Carbatrol  200 mg 3 in AM, 2 PM  04/06/23 appt :  Still lithium  CR 300 AM and 600 PM, Carbatrol  200 mg 3 in AM, 2 PM, Ambien  10 HS prn Consistent with meds  Doing great.  No mood swings. No SE No complaints. Patient reports stable mood and denies depressed or irritable moods.  Patient denies any recent difficulty with anxiety.  Patient denies difficulty with sleep initiation or maintenance. Denies appetite disturbance.  Patient reports that energy and motivation have been good.  Patient denies any difficulty with concentration.  Patient denies any suicidal ideation. PE June 5.  11/28/23 appt noted: Son 21  Christine Cisneros has aviations degree.   And  looking for commercial license.  Has to fly a lot more to get to driver's license.   Trouble with dry eyes and use Restasis but recently worse.  Wonders if related to meds.  Eye doctor in a month.   Meds as above. Another question about bipolar vs schizoaffective.   Asks about supplements for immunity.   Son with no problems with mood disorder but questions about wondering.   Mood stable .  Works 36-45 hours/week.    Changing PCP to [facility].  Past Psychiatric Medication Trials: Lithium  1200 daily, carbamazepine  400 twice daily, Depakote weight gain and tired,  olanzapine, Geodon no response, Abilify 15,  Ambien   Review of Systems:  Review of Systems  Cardiovascular:  Negative for palpitations.  Gastrointestinal:  Negative for diarrhea.  Neurological:  Negative for tremors.  Psychiatric/Behavioral:  Negative for dysphoric mood.     Medications: I have reviewed the patient's current medications.  Current Outpatient Medications  Medication Sig Dispense Refill   calcium carbonate  200 MG capsule Take 600 mg by mouth daily.      carbamazepine  (CARBATROL ) 200 MG 12 hr capsule TAKE 2 CAPSULES IN THE     MORNING, AND 3 CAPSULES IN THE EVENING 35 capsule 0   cholecalciferol (VITAMIN D) 1000 UNITS tablet Take 5,000 Units by mouth daily.      Chromium 200 MCG TABS Take by mouth.     lithium  carbonate (LITHOBID ) 300 MG ER tablet TAKE 3 TABLETS DAILY 21 tablet 0   metoprolol  tartrate (LOPRESSOR ) 100 MG tablet TAKE 1 TABLET (100 MG TOTAL) BY MOUTH 2 (TWO) TIMES DAILY. NEEDS APPOINTMENT (Patient taking differently: Take 100 mg by mouth 2 (two) times daily. NEEDS APPOINTMENT of ER) 60 tablet 0   Multiple Vitamin (MULTIVITAMIN) tablet Take 1 tablet by mouth daily.     zolpidem  (AMBIEN ) 10 MG tablet Take 1 tablet (10 mg total) by mouth at bedtime as needed for sleep. 30 tablet 0   No current facility-administered medications for this visit.    Medication Side Effects: None  Allergies:   Allergies  Allergen Reactions   Norvasc  [Amlodipine  Besylate] Shortness Of Breath and Nausea And Vomiting    chest tightness    Past Medical History:  Diagnosis Date   Bipolar 1 disorder (HCC)    Hypertension     Family History  Problem Relation Age of Onset   Cancer Father        melanoma   Hyperlipidemia Father    Diabetes Maternal Grandfather    Diabetes Paternal Grandmother     Social History   Socioeconomic History   Marital status: Single    Spouse name: Not on file   Number of children: Not on file   Years of education: Not on file   Highest education level: Not on file  Occupational History   Occupation: RN  Tobacco Use   Smoking status: Never   Smokeless tobacco: Never  Substance and Sexual Activity   Alcohol use: No    Alcohol/week: 0.0 standard drinks of alcohol   Drug use: No   Sexual activity: Yes    Partners: Male  Other Topics Concern   Not on file  Social History Narrative   Not on file   Social Drivers of Health   Financial Resource Strain: Not on file  Food Insecurity: Low Risk  (04/13/2023)   [facility]   Hunger Vital Sign    Worried About Running Out of Food in the Last Year: Never true    Ran Out of Food in the Last Year: Never true  Transportation Needs: No Transportation Needs (04/13/2023)   [facility]   Transportation    In the past 12 months, has lack of reliable transportation kept you from medical appointments, meetings, work or from getting things needed for daily living? : No  Physical Activity: Not on file  Stress: Not on file  Social Connections: Unknown (03/27/2022)   [facility] from [facility]   Social Network    Social Network: Not on file  Intimate Partner Violence: Unknown (02/16/2022)   [facility] from [facility], [facility]   HITS    Physically Hurt: Not on file    Insult or Talk Down To: Not on file     Threaten Physical Harm: Not on file    Scream or Curse: Not on file    Past Medical History, Surgical history, Social history, and Family history  were reviewed and updated as appropriate.   Please see review of systems for further details on the patient's review from today.   Objective:   Physical Exam:  There were no vitals taken for this visit.  Physical Exam Neurological:     Mental Status: She is alert and oriented to person, place, and time.     Cranial Nerves: No dysarthria.  Psychiatric:        Attention and Perception: Attention and perception normal.        Mood and Affect: Mood is anxious. Mood is not depressed.        Speech: Speech normal.        Behavior: Behavior is cooperative.        Thought Content: Thought content normal. Thought content is not paranoid or delusional. Thought content does not include homicidal or suicidal ideation. Thought content does not include suicidal plan.        Cognition and Memory: Cognition and memory normal.        Judgment: Judgment normal.     Comments: Insight intact     Lab Review:     Component Value Date/Time   NA 139 05/26/2023 0934   K 4.5 05/26/2023 0934   CL 101 05/26/2023 0934   CO2 23 05/26/2023 0934   GLUCOSE 147 (H) 05/26/2023 0934   GLUCOSE 100 (H) 10/05/2019 0825   BUN 11 05/26/2023 0934   CREATININE 0.74 05/26/2023 0934   CREATININE 0.91 10/05/2019 0825   CALCIUM 9.8 05/26/2023 0934   PROT 8.2 (H) 09/18/2019 1551   ALBUMIN 4.7 09/18/2019 1551   AST 22 09/18/2019 1551   ALT 40 09/18/2019 1551   ALKPHOS 145 (H) 09/18/2019 1551   BILITOT 0.4 09/18/2019 1551   GFRNONAA >60 09/19/2019 1121   GFRNONAA 79 06/01/2019 1545   GFRAA >60 09/19/2019 1121   GFRAA 91 06/01/2019 1545       Component Value Date/Time   WBC 10.6 (H) 09/18/2019 1551   RBC 3.87 09/18/2019 1551   HGB 12.4 09/18/2019 1551   HCT 37.0 09/18/2019 1551   PLT 340 09/18/2019 1551   MCV 95.6 09/18/2019 1551   MCH 32.0 09/18/2019 1551    MCHC 33.5 09/18/2019 1551   RDW 12.1 09/18/2019 1551   LYMPHSABS 1.0 09/18/2019 1551   MONOABS 0.6 09/18/2019 1551   EOSABS 0.1 09/18/2019 1551   BASOSABS 0.0 09/18/2019 1551    Lithium   Date Value Ref Range Status  08/02/2016 0.7  Final   Lithium  Lvl  Date Value Ref Range Status  09/05/2023 0.7 0.5 - 1.2 mmol/L Final    Comment:    A concentration of 0.5-0.8 mmol/L is advised for long-term use; concentrations of up to 1.2 mmol/L may be necessary during acute treatment.                                  Detection Limit = 0.1                           <0.1 indicates None Detected   Lithium  0.7 @3 /14/24 on 900 mg HS  March 03, 2020 lithium  level 0.6 on 900 mg daily.  No med changes 07/02/22 lithium  0.7 on 900 mg daily Lab Results  Component Value Date   CBMZ 4.0 09/19/2019     .res Assessment: Plan:    Bipolar I disorder (HCC)  Lithium  use  Lithium -induced tremor    lithium  toxicity 09/2019.  Her bipolar disorder has been relatively stable for several years.  She had a couple of severe manic episodes around 2006 and had to be hospitalized for 1 of them.  lithium  toxicity 09/2019.  Lithium  dose reduced from 1200 to 900 mg daily. Mood has been stable since reduction in lithium  dose.  Doing well with CBZ and lithium  until the lithium  toxicity September 18 2019 with hospital stay.. Mood is stable.  She is on a lower dose of lithium  but has a good blood level of 0.6 and that is been consistent over the last couple of checks.  Counseled patient regarding potential benefits, risks, and side effects of lithium  to include potential risk of lithium  affecting thyroid  and renal function.  Discussed need for periodic lab monitoring to determine drug level and to assess for potential adverse effects.  Counseled patient regarding signs and symptoms of lithium  toxicity and advised that they notify office immediately or seek urgent medical attention if experiencing these signs and symptoms.   Patient advised to contact office with any questions or concerns.  Discussed that some blood pressure meds can affect lithium  levels and cause him to go up.  Discussed the type of blood pressure meds she can use and there potential effects on lithium  levels which can be monitored.  The most important type to avoid would be the thiazide diuretics.  We also discussed nonsteroidal anti-inflammatories and their risks with lithium . Rare lithium  tremor resolved.   Disc lithium  level, CBZ level, etc. She's nervous about the lower lithium  dosage with fear of mania. Mania has not recurred with less lithium  with levels in low normal range Consider increase CBZ to compensate for lower lithium  dose if needed. Also low dose CBZ.  Stable mood. Carbatrol  XR 400 AM and 600 PM Disc SE .  Vertigo resolved with current carbamazepine  dosing.  Check lithium  at one month after last level and then repeat every 3 mos [phone] [facility] [place] fax.  Sent order. Lithium  0.7 @3 /14/24 on 900 mg HS 07/23/21 lithium  0.6 stable. Normal CMP and TSH 12/2020 03/2022 Lithium  0.7 and no SE including no tremor. On 900 mg daily 01/29/22 normal BMP and CA 9.8 and TSH 07/02/22 lithium  0.7 on 900 mg daily  Disc in detail risk factors for lithium  toxicity: Gave her a copy previously and discussed the contents of the article by Dr. Lamar post published in 2018 on the underutilization of lithium .  Her primary Cisneros doctor has been critical of her being on lithium  even though she has no adverse effects.  Educated her about the long-term benefits of lithium  as opposed to other mood stabilizers and that in her case the only alternative would be atypical antipsychotics.  Discussed long-term side effect risks of those medications as well.  She elects to continue the lithium  as it is my recommendation.  She wants to avoid the risk of manic psychosis that she experienced years ago so doesn't want any med changes.   Disc concerns about son and  might develop bipolar but not sx.    FU 6 mos  Lorene Macintosh, MD, DFAPA  Please see After Visit Summary for patient specific instructions.  No future appointments.   No orders of the defined types were placed in this encounter.     -------------------------------

## 2023-12-13 DIAGNOSIS — H04123 Dry eye syndrome of bilateral lacrimal glands: Secondary | ICD-10-CM | POA: Diagnosis not present

## 2024-02-16 ENCOUNTER — Telehealth: Payer: Self-pay | Admitting: Psychiatry

## 2024-02-16 ENCOUNTER — Other Ambulatory Visit: Payer: Self-pay | Admitting: Psychiatry

## 2024-02-16 DIAGNOSIS — Z79899 Other long term (current) drug therapy: Secondary | ICD-10-CM

## 2024-02-16 NOTE — Telephone Encounter (Signed)
 Mom called and said that a lithuim order needs to be faxed to the [facility] in [place]. The fax number is  [phone]

## 2024-02-16 NOTE — Telephone Encounter (Signed)
 Lab order for lithium sent and faxed to [facility].

## 2024-02-17 DIAGNOSIS — Z79899 Other long term (current) drug therapy: Secondary | ICD-10-CM | POA: Diagnosis not present

## 2024-02-18 LAB — LITHIUM LEVEL: Lithium Lvl: 0.6 mmol/L (ref 0.5–1.2)

## 2024-03-18 DIAGNOSIS — J209 Acute bronchitis, unspecified: Secondary | ICD-10-CM | POA: Diagnosis not present

## 2024-03-18 DIAGNOSIS — R059 Cough, unspecified: Secondary | ICD-10-CM | POA: Diagnosis not present

## 2024-05-10 ENCOUNTER — Other Ambulatory Visit: Payer: Self-pay | Admitting: Psychiatry

## 2024-05-10 DIAGNOSIS — F319 Bipolar disorder, unspecified: Secondary | ICD-10-CM

## 2024-05-28 ENCOUNTER — Ambulatory Visit: Payer: 59 | Admitting: Psychiatry

## 2024-05-31 ENCOUNTER — Other Ambulatory Visit: Payer: Self-pay | Admitting: Psychiatry

## 2024-05-31 DIAGNOSIS — Z79899 Other long term (current) drug therapy: Secondary | ICD-10-CM

## 2024-06-08 ENCOUNTER — Telehealth: Payer: Self-pay | Admitting: Psychiatry

## 2024-06-08 NOTE — Telephone Encounter (Addendum)
 Nancy-mom called requesting Lithium  Level lab order to [facility].  Faxed to [phone] per [place] request. [place] location. Phone [facility] [phone]

## 2024-06-08 NOTE — Telephone Encounter (Signed)
 Lab order was sent on 7/17. Mom notified.

## 2024-06-21 DIAGNOSIS — Z79899 Other long term (current) drug therapy: Secondary | ICD-10-CM | POA: Diagnosis not present

## 2024-06-22 LAB — LITHIUM LEVEL: Lithium Lvl: 0.7 mmol/L (ref 0.5–1.2)

## 2024-07-09 ENCOUNTER — Encounter: Payer: Self-pay | Admitting: Psychiatry

## 2024-07-09 ENCOUNTER — Ambulatory Visit (INDEPENDENT_AMBULATORY_CARE_PROVIDER_SITE_OTHER): Admitting: Psychiatry

## 2024-07-09 DIAGNOSIS — Z79899 Other long term (current) drug therapy: Secondary | ICD-10-CM

## 2024-07-09 DIAGNOSIS — F5105 Insomnia due to other mental disorder: Secondary | ICD-10-CM

## 2024-07-09 DIAGNOSIS — F319 Bipolar disorder, unspecified: Secondary | ICD-10-CM | POA: Diagnosis not present

## 2024-07-09 DIAGNOSIS — G251 Drug-induced tremor: Secondary | ICD-10-CM

## 2024-07-09 DIAGNOSIS — Z1231 Encounter for screening mammogram for malignant neoplasm of breast: Secondary | ICD-10-CM | POA: Diagnosis not present

## 2024-07-09 MED ORDER — ZOLPIDEM TARTRATE 10 MG PO TABS: 10.0000 mg | ORAL_TABLET | Freq: Every evening | ORAL | 0 refills | Status: AC | PRN

## 2024-07-09 MED ORDER — CARBAMAZEPINE ER 200 MG PO CP12: ORAL_CAPSULE | ORAL | 1 refills | Status: AC

## 2024-07-09 MED ORDER — LITHIUM CARBONATE ER 300 MG PO TBCR: 900.0000 mg | EXTENDED_RELEASE_TABLET | Freq: Every day | ORAL | 1 refills | Status: AC

## 2024-07-09 NOTE — Progress Notes (Signed)
 Christine Cisneros [id number] [birth year] 53 y.o.   Virtual Visit via Telephone Note  I connected with pt by telephone and verified that I am speaking with the correct person using two identifiers.   I discussed the limitations, risks, security and privacy concerns of performing an evaluation and management service by telephone and the availability of in person appointments. I also discussed with the patient that there may be a patient responsible charge related to this service. The patient expressed understanding and agreed to proceed.  I discussed the assessment and treatment plan with the patient. The patient was provided an opportunity to ask questions and all were answered. The patient agreed with the plan and demonstrated an understanding of the instructions.   The patient was advised to call back or seek an in-person evaluation if the symptoms worsen or if the condition fails to improve as anticipated.  I provided 20 minutes of non-face-to-face time during this encounter. The call started at [phone] and ended at [phone]. The patient was located at home and the provider was located office.   Subjective:   Patient ID:  Christine Cisneros is a 53 y.o. (DOB 25-Feb-1971) female.  Chief Complaint:  Chief Complaint  Patient presents with   Follow-up    Mood and meds    HPI Christine Cisneros presents to the office today for follow-up of bipolar disorder.    when seen August 20, 2019.[name] She was doing well no meds were changed.  She had a hospitalization for 24 hours on September 18, 2019 with lithium  toxicity with a level of 2.32.  The pharmacist review of her meds and supplements suggested it was related to a zinc and pumpkin seed supplement.. Lithium  level on DC November 4 = 1.17.  On September 21, 2019 she would advise to resume lithium  at a lower dosage of 300 mg in the morning and 600 mg in the evening on Monday Wednesday and Friday and 600 mg twice daily on the other days of the week.  It was recommended to repeat  lithium  level in about 2 weeks from that date.  seen November 19, 2019.  The following was noted.  No tremor or Sx lithium  toxicity and lithium  1.0.  No mood sx either on. No recent med changes. Recovered pretty well except started tremor in legs a little today.  And didn't have tremors before the toxicity.   Mood is fine overall.  But is a little anxious over the situation. Has chronic anxiety about being sick and it upset her sense of stability to have the episode of lithium  toxicity.  Lithium  level and BMP were ordered.  As of appointment February 18, 2020: Several lithium  levels have been obtained since her last appointment.  Last lithium  level was 0.7 on February 04, 2020 at 900 mg daily.  That was the same level as 2 weeks prior and no meds were changed. No problems since the last visit. Because of recent problems with lithium  toxicity the following was discussed: Disc labs including CMP and elevated Alk Phos and suggest FU with PCP. Disc lithium  level, CBZ level, etc. She's nervous about the lower lithium  dosage with fear of mania. Consider increase CBZ to compensate for lower lithium  dose. Also low dose CBZ.  She agrees to increase CBZ and wants to switch to Carbatrol  bc hx double vision with higher doses CBZ IR. Carbatrol  XR 400 AM and 600 PM Disc SE Check lithium  at one month after last level and then repeat every 3 mos  04/11/2020 phone call patient experiencing vertigo wondering what her lithium  levels were. Last lithium  level received 0.6 from May 29, 2020 still on 900 mg daily with no med changes.  06/02/20 APPT with the following noted: Lab requests not being received at either lab, [facility] or [facility] when sent. No med changes since here.  Labs stable.  No mood swings.  Vertigo resolved after a few days.  No SE with either lithium  900 or CBZ ER 400 AM and 600PM.  Rare diarrhea with diet changes. 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 Now  changed to [facility].   No med changes  11/24/20 appt noted: Parents doing well and good Xmas. Disc concern from PCP who asked about lithium .  She then had another doctor question her use of lithium  but without any specific reason.  Wonders if she should continue lithium .  Discussed the doubt that that created.  05/28/21 appt noted: M knee replacement and helping her. No problems with meds or SE.  No mood swings. Has PCP checking other labs. Plan: Continue current meds, lithium  900 mg q. at bedtime, carbamazepine  XR 600 mg every morning and 900 mg nightly.  10/01/2021 appointment with the following noted: Off lisinopril  bc Cough.  Now on metoprolol  BID working well. Still doing great without mood swings.  Sleep is fine.   PCP Dr. Ernestine.  Really good.  No concerns expressed over lithium . SE none except occ metallic taste.  Was worse after lithium  toxicity. No vertigo. Son Christine Cisneros working on Education officer, community and parents doing well. Patient reports stable mood and denies depressed or irritable moods.  No mania.  Patient denies any recent difficulty with anxiety.  Patient denies difficulty with sleep initiation or maintenance. Denies appetite disturbance.  Patient reports that energy and motivation have been good.  Patient denies any difficulty with concentration.  Patient denies any suicidal ideation.  04/01/22 appt noted: Recovered well with Covid. Still doing well. Patient reports stable mood and denies depressed or irritable moods.  Patient denies any recent difficulty with anxiety.  Patient denies difficulty with sleep initiation or maintenance. Denies appetite disturbance.  Patient reports that energy and motivation have been good.  Patient denies any difficulty with concentration.  Patient denies any suicidal ideation. Lithium  0.7 and no SE including no tremor. On 900 mg daily  09/30/22 appt noted: [phone] [facility] [place] fax Doing well.  Working [employer].  No SE.  Son now Artist and  moving along. Patient reports stable mood and denies depressed or irritable moods.  Patient denies any recent difficulty with anxiety.  Patient denies difficulty with sleep initiation or maintenance. Denies appetite disturbance.  Patient reports that energy and motivation have been good.  Patient denies any difficulty with concentration.  Patient denies any suicidal ideation. Still lithium  CR 300 AM and 600 PM, Carbatrol  200 mg 3 in AM, 2 PM  04/06/23 appt :  Still lithium  CR 300 AM and 600 PM, Carbatrol  200 mg 3 in AM, 2 PM, Ambien  10 HS prn Consistent with meds  Doing great.  No mood swings. No SE No complaints. Patient reports stable mood and denies depressed or irritable moods.  Patient denies any recent difficulty with anxiety.  Patient denies difficulty with sleep initiation or maintenance. Denies appetite disturbance.  Patient reports that energy and motivation have been good.  Patient denies any difficulty with concentration.  Patient denies any suicidal ideation. PE June 5.  11/28/23 appt noted: Son  21  Christine Cisneros has aviations degree.   And looking for commercial license.  Has to fly a lot more to get to driver's license.   Trouble with dry eyes and use Restasis but recently worse.  Wonders if related to meds.  Eye doctor in a month.   Meds as above. Another question about bipolar vs schizoaffective.   Asks about supplements for immunity.   Son with no problems with mood disorder but questions about wondering.   Mood stable .  Works 36-45 hours/week.   Plan no changes  07/09/24 appt noted:  Med:  lithium  CR 300 AM and 600 PM, Carbatrol  200 mg 3 in AM, 2 PM, Ambien  10 HS prn No SE Patient reports stable mood and denies depressed or irritable moods.  Patient denies any recent difficulty with anxiety.  Patient denies difficulty with sleep initiation or maintenance. Denies appetite disturbance.  Patient reports that energy and motivation have been good.  Patient denies any difficulty with  concentration.  Patient denies any suicidal ideation. No new meds.  Consistent.   Health fine.  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   Changing PCP to [facility].  Past Psychiatric Medication Trials: Lithium  1200 daily, carbamazepine  400 twice daily, Depakote weight gain and tired,  olanzapine, Geodon no response, Abilify 15,  Ambien   Review of Systems:  Review of Systems  Cardiovascular:  Negative for palpitations.  Gastrointestinal:  Negative for diarrhea.  Neurological:  Negative for tremors.  Psychiatric/Behavioral:  Negative for dysphoric mood. The patient is not nervous/anxious.     Medications: I have reviewed the patient's current medications.  Current Outpatient Medications  Medication Sig Dispense Refill   calcium carbonate 200 MG capsule Take 600 mg by mouth daily.      cholecalciferol (VITAMIN D) 1000 UNITS tablet Take 5,000 Units by mouth daily.      Chromium 200 MCG TABS Take by mouth.     metoprolol  tartrate (LOPRESSOR ) 100 MG tablet TAKE 1 TABLET (100 MG TOTAL) BY MOUTH 2 (TWO) TIMES DAILY. NEEDS APPOINTMENT (Patient taking differently: Take 100 mg by mouth 2 (two) times daily. NEEDS APPOINTMENT of ER) 60 tablet 0   Multiple Vitamin (MULTIVITAMIN) tablet Take 1 tablet by mouth daily.     carbamazepine  (CARBATROL ) 200 MG 12 hr capsule TAKE 2 CAPSULES IN THE MORNING AND 3 CAPSULES IN  THE EVENING 450 capsule 1   lithium  carbonate (LITHOBID ) 300 MG ER tablet Take 3 tablets (900 mg total) by mouth daily. 270 tablet 1   zolpidem  (AMBIEN ) 10 MG tablet Take 1 tablet (10 mg total) by mouth at bedtime as needed for sleep. 30 tablet 0   No current facility-administered medications for this visit.    Medication Side Effects: None  Allergies:  Allergies  Allergen Reactions   Norvasc  [Amlodipine  Besylate] Shortness Of Breath and Nausea And Vomiting    chest tightness    Past Medical History:  Diagnosis Date    Bipolar 1 disorder (HCC)    Hypertension     Family History  Problem Relation Age of Onset   Cancer Father        melanoma   Hyperlipidemia Father    Diabetes Maternal Grandfather    Diabetes Paternal Grandmother     Social History   Socioeconomic History   Marital status: Single    Spouse name: Not on file   Number of children: Not on file   Years of education: Not on  file   Highest education level: Not on file  Occupational History   Occupation: RN  Tobacco Use   Smoking status: Never   Smokeless tobacco: Never  Substance and Sexual Activity   Alcohol use: No    Alcohol/week: 0.0 standard drinks of alcohol   Drug use: No   Sexual activity: Yes    Partners: Male  Other Topics Concern   Not on file  Social History Narrative   Not on file   Social Drivers of Health   Financial Resource Strain: Not on file  Food Insecurity: Low Risk  (04/13/2023)   Received from [facility]   Hunger Vital Sign    Within the past 12 months, you worried that your food would run out before you got money to buy more: Never true    Within the past 12 months, the food you bought just didn't last and you didn't have money to get more. : Never true  Transportation Needs: No Transportation Needs (04/13/2023)   Received from [facility]    In the past 12 months, has lack of reliable transportation kept you from medical appointments, meetings, work or from getting things needed for daily living? : No  Physical Activity: Not on file  Stress: Not on file  Social Connections: Unknown (03/27/2022)   Received from [facility]   Social Network    Social Network: Not on file  Intimate Partner Violence: Unknown (02/16/2022)   Received from [facility]   HITS    Physically Hurt: Not on file    Insult or Talk Down To: Not on file    Threaten Physical Harm: Not on file    Scream or Curse: Not on file    Past Medical History, Surgical history, Social history, and Family  history were reviewed and updated as appropriate.   Please see review of systems for further details on the patient's review from today.   Objective:   Physical Exam:  There were no vitals taken for this visit.  Physical Exam Neurological:     Mental Status: She is alert and oriented to person, place, and time.     Cranial Nerves: No dysarthria.  Psychiatric:        Attention and Perception: Attention and perception normal.        Mood and Affect: Mood is not anxious or depressed.        Speech: Speech normal.        Behavior: Behavior is cooperative.        Thought Content: Thought content normal. Thought content is not paranoid or delusional. Thought content does not include homicidal or suicidal ideation. Thought content does not include suicidal plan.        Cognition and Memory: Cognition and memory normal.        Judgment: Judgment normal.     Comments: Insight intact     Lab Review:     Component Value Date/Time   NA 139 05/26/2023 0934   K 4.5 05/26/2023 0934   CL 101 05/26/2023 0934   CO2 23 05/26/2023 0934   GLUCOSE 147 (H) 05/26/2023 0934   GLUCOSE 100 (H) 10/05/2019 0825   BUN 11 05/26/2023 0934   CREATININE 0.74 05/26/2023 0934   CREATININE 0.91 10/05/2019 0825   CALCIUM 9.8 05/26/2023 0934   PROT 8.2 (H) 09/18/2019 1551   ALBUMIN 4.7 09/18/2019 1551   AST 22 09/18/2019 1551   ALT 40 09/18/2019 1551   ALKPHOS 145 (H)  09/18/2019 1551   BILITOT 0.4 09/18/2019 1551   GFRNONAA >60 09/19/2019 1121   GFRNONAA 79 06/01/2019 1545   GFRAA >60 09/19/2019 1121   GFRAA 91 06/01/2019 1545       Component Value Date/Time   WBC 10.6 (H) 09/18/2019 1551   RBC 3.87 09/18/2019 1551   HGB 12.4 09/18/2019 1551   HCT 37.0 09/18/2019 1551   PLT 340 09/18/2019 1551   MCV 95.6 09/18/2019 1551   MCH 32.0 09/18/2019 1551   MCHC 33.5 09/18/2019 1551   RDW 12.1 09/18/2019 1551   LYMPHSABS 1.0 09/18/2019 1551   MONOABS 0.6 09/18/2019 1551   EOSABS 0.1 09/18/2019 1551    BASOSABS 0.0 09/18/2019 1551    Lithium   Date Value Ref Range Status  08/02/2016 0.7  Final   Lithium  Lvl  Date Value Ref Range Status  06/21/2024 0.7 0.5 - 1.2 mmol/L Final    Comment:    A concentration of 0.5-0.8 mmol/L is advised for long-term use; concentrations of up to 1.2 mmol/L may be necessary during acute treatment.                                  Detection Limit = 0.1                           <0.1 indicates None Detected   Lithium  0.7 @3 /14/24 on 900 mg HS  March 03, 2020 lithium  level 0.6 on 900 mg daily.  No med changes 07/02/22 lithium  0.7 on 900 mg daily Lab Results  Component Value Date   CBMZ 4.0 09/19/2019     .res Assessment: Plan:    Bipolar I disorder (HCC) - Plan: Lithium  level, Basic metabolic panel, TSH, lithium  carbonate (LITHOBID ) 300 MG ER tablet, carbamazepine  (CARBATROL ) 200 MG 12 hr capsule  Lithium  use - Plan: Lithium  level, Basic metabolic panel, TSH  Lithium -induced tremor  Insomnia due to mental condition - Plan: zolpidem  (AMBIEN ) 10 MG tablet    lithium  toxicity 09/2019.  Her bipolar disorder has been relatively stable for several years.  She had a couple of severe manic episodes around 2006 and had to be hospitalized for 1 of them.  lithium  toxicity 09/2019.  Lithium  dose reduced from 1200 to 900 mg daily. Mood has been stable since reduction in lithium  dose.  Doing well with CBZ and lithium  until the lithium  toxicity September 18 2019 with hospital stay.. Mood is stable.  She is on a lower dose of lithium  but has a good blood level of 0.6 and that is been consistent over the last couple of checks.  Counseled patient regarding potential benefits, risks, and side effects of lithium  to include potential risk of lithium  affecting thyroid  and renal function.  Discussed need for periodic lab monitoring to determine drug level and to assess for potential adverse effects.  Counseled patient regarding signs and symptoms of lithium  toxicity and  advised that they notify office immediately or seek urgent medical attention if experiencing these signs and symptoms.  Patient advised to contact office with any questions or concerns.  Discussed that some blood pressure meds can affect lithium  levels and cause him to go up.  Discussed the type of blood pressure meds she can use and there potential effects on lithium  levels which can be monitored.  The most important type to avoid would be the thiazide diuretics.  We also discussed nonsteroidal  anti-inflammatories and their risks with lithium . Rare lithium  tremor resolved.   Disc lithium  level, CBZ level, etc. She's nervous about the lower lithium  dosage with fear of mania. Mania has not recurred with less lithium  with levels in low normal range Consider increase CBZ to compensate for lower lithium  dose if needed. Also low dose CBZ.  Stable mood. Carbatrol  XR 400 AM and 600 PM Disc SE .  Vertigo resolved with current carbamazepine  dosing.  Check lithium  at one month after last level and then repeat every 3 mos [phone] [facility] [place] fax.  Sent order. Lithium  0.7 @3 /14/24 on 900 mg HS 07/23/21 lithium  0.6 stable. Normal CMP and TSH 12/2020 03/2022 Lithium  0.7 and no SE including no tremor. On 900 mg daily 01/29/22 normal BMP and CA 9.8 and TSH 07/02/22 lithium  0.7 on 900 mg daily  Disc in detail risk factors for lithium  toxicity: Gave her a copy previously and discussed the contents of the article by Dr. Lamar post published in 2018 on the underutilization of lithium .  Her primary care doctor has been critical of her being on lithium  even though she has no adverse effects.  Educated her about the long-term benefits of lithium  as opposed to other mood stabilizers and that in her case the only alternative would be atypical antipsychotics.  Discussed long-term side effect risks of those medications as well.  She elects to continue the lithium  as it is my recommendation.  She wants to avoid the  risk of manic psychosis that she experienced years ago so doesn't want any med changes.   Disc concerns about son and might develop bipolar but not sx.    FU 6 mos  Christine Macintosh, MD, DFAPA  Please see After Visit Summary for patient specific instructions.  No future appointments.   Orders Placed This Encounter  Procedures   Lithium  level   Basic metabolic panel   TSH      -------------------------------

## 2024-11-22 LAB — BASIC METABOLIC PANEL WITH GFR
BUN/Creatinine Ratio: 18 (ref 9–23)
BUN: 12 mg/dL (ref 6–24)
CO2: 26 mmol/L (ref 20–29)
Calcium: 10 mg/dL (ref 8.7–10.2)
Chloride: 102 mmol/L (ref 96–106)
Creatinine, Ser: 0.66 mg/dL (ref 0.57–1.00)
Glucose: 127 mg/dL — ABNORMAL HIGH (ref 70–99)
Potassium: 4.8 mmol/L (ref 3.5–5.2)
Sodium: 143 mmol/L (ref 134–144)
eGFR: 104 mL/min/1.73

## 2024-11-22 LAB — TSH: TSH: 1.2 u[IU]/mL (ref 0.450–4.500)

## 2024-11-22 LAB — LITHIUM LEVEL: Lithium Lvl: 0.6 mmol/L (ref 0.5–1.2)

## 2025-02-14 ENCOUNTER — Ambulatory Visit: Admitting: Psychiatry
